# Patient Record
Sex: Female | Born: 1995 | Race: Black or African American | Hispanic: No | Marital: Single | Smoking: Current some day smoker
Health system: Southern US, Community
[De-identification: ages and names within clinical notes are randomized; demographics above are authoritative.]

---
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
## 2016-06-09 ENCOUNTER — Encounter (HOSPITAL_BASED_OUTPATIENT_CLINIC_OR_DEPARTMENT_OTHER): Payer: Self-pay | Admitting: Emergency Medicine

## 2016-06-09 ENCOUNTER — Emergency Department (HOSPITAL_BASED_OUTPATIENT_CLINIC_OR_DEPARTMENT_OTHER)
Admission: EM | Admit: 2016-06-09 | Discharge: 2016-06-09 | Disposition: A | Discharge status: Home / Self Care | Payer: Self-pay | Attending: Emergency Medicine | Admitting: Emergency Medicine

## 2016-06-09 DIAGNOSIS — A599 Trichomoniasis, unspecified: Secondary | ICD-10-CM | POA: Insufficient documentation

## 2016-06-09 DIAGNOSIS — N939 Abnormal uterine and vaginal bleeding, unspecified: Secondary | ICD-10-CM | POA: Insufficient documentation

## 2016-06-09 LAB — WET PREP, GENITAL
Clue Cells Wet Prep HPF POC: NONE SEEN
Sperm: NONE SEEN
Yeast Wet Prep HPF POC: NONE SEEN

## 2016-06-09 LAB — PREGNANCY, URINE: PREG TEST UR: NEGATIVE

## 2016-06-09 LAB — URINALYSIS, ROUTINE W REFLEX MICROSCOPIC
Bilirubin Urine: NEGATIVE
GLUCOSE, UA: NEGATIVE mg/dL
Ketones, ur: NEGATIVE mg/dL
Nitrite: NEGATIVE
PH: 6 (ref 5.0–8.0)
PROTEIN: NEGATIVE mg/dL
Specific Gravity, Urine: 1.022 (ref 1.005–1.030)

## 2016-06-09 LAB — URINALYSIS, MICROSCOPIC (REFLEX)

## 2016-06-09 MED ORDER — AZITHROMYCIN 250 MG PO TABS
1000.0000 mg | ORAL_TABLET | Freq: Once | ORAL | Status: AC
Start: 2016-06-09 — End: 2016-06-09
  Administered 2016-06-09: 1000 mg via ORAL
  Filled 2016-06-09: qty 4

## 2016-06-09 MED ORDER — CEFTRIAXONE SODIUM 250 MG IJ SOLR
250.0000 mg | Freq: Once | INTRAMUSCULAR | Status: AC
  Administered 2016-06-09: 250 mg via INTRAMUSCULAR
  Filled 2016-06-09: qty 250

## 2016-06-09 MED ORDER — METRONIDAZOLE 500 MG PO TABS
2000.0000 mg | ORAL_TABLET | Freq: Once | ORAL | Status: AC
  Administered 2016-06-09: 2000 mg via ORAL
  Filled 2016-06-09: qty 4

## 2016-06-09 MED ORDER — LIDOCAINE HCL (PF) 2 % IJ SOLN
INTRAMUSCULAR | Status: AC
  Administered 2016-06-09: 5 mL
  Filled 2016-06-09: qty 2

## 2016-06-09 NOTE — Discharge Instructions (Signed)

## 2016-06-09 NOTE — ED Provider Notes (Signed)
Emergency Department Provider Note   I have reviewed the triage vital signs and the nursing notes.   HISTORY  Chief Complaint Vaginal Bleeding   HPI Jennifer Schroeder is a 21 y.o. female with no significant PMH presents to the emergency department for evaluation of irregular vaginal bleeding. Patient states that her menstrual period started 1 week ago. She typically has 7 days of bleeding but approximately day for she had bleeding that stopped and then the next day started again very heavy with some clot passage. She did take 2 home pregnancy tests that were both negative. She is concerned that she could've had a miscarriage and is wondering about this today. She denies any nausea or vomiting. She does have some mild right lower quadrant pain that is cramping in quality and nonradiating. Her menstrual period stopped yesterday and she's had no additional bleeding this morning. No lightheadedness, difficulty breathing, other concerning symptoms.  History reviewed. No pertinent past medical history.  There are no active problems to display for this patient.   History reviewed. No pertinent surgical history.  Current Outpatient Rx  . Order #: [id number] Class: Print    Allergies Patient has no known allergies.  History reviewed. No pertinent family history.  Social History Social History  Substance Use Topics  . Smoking status: Never Smoker  . Smokeless tobacco: Never Used  . Alcohol use No    Review of Systems  Constitutional: No fever/chills Eyes: No visual changes. ENT: No sore throat. Cardiovascular: Denies chest pain. Respiratory: Denies shortness of breath. Gastrointestinal: Cramping abdominal pain.  No nausea, no vomiting.  No diarrhea.  No constipation. Genitourinary: Negative for dysuria. Positive vaginal bleeding.  Musculoskeletal: Negative for back pain. Skin: Negative for rash. Neurological: Negative for headaches, focal weakness or numbness.  10-point ROS  otherwise negative.  ____________________________________________   PHYSICAL EXAM:  VITAL SIGNS: ED Triage Vitals  Enc Vitals Group     BP 06/09/16 0933 122/67     Pulse Rate 06/09/16 0933 64     Resp 06/09/16 0933 18     Temp 06/09/16 0933 98.5 F (36.9 C)     Temp Source 06/09/16 0933 Oral     SpO2 06/09/16 0933 100 %     Weight 06/09/16 0934 170 lb (77.1 kg)     Height 06/09/16 0934  (1.626 m)   Constitutional: Alert and oriented. Well appearing and in no acute distress. Eyes: Conjunctivae are normal.  Head: Atraumatic. Nose: No congestion/rhinnorhea. Mouth/Throat: Mucous membranes are moist.   Neck: No stridor.   Cardiovascular: Normal rate, regular rhythm. Good peripheral circulation. Grossly normal heart sounds.   Respiratory: Normal respiratory effort.  No retractions. Lungs CTAB. Gastrointestinal: Soft and nontender. No distention.  Genitourinary: No vaginal bleeding. Mild discharge. No CMT or adnexal tenderness to fullness.  Musculoskeletal: No lower extremity tenderness nor edema. No gross deformities of extremities. Neurologic:  Normal speech and language. No gross focal neurologic deficits are appreciated.  Skin:  Skin is warm, dry and intact. No rash noted. Psychiatric: Mood and affect are normal. Speech and behavior are normal.  ____________________________________________   LABS (all labs ordered are listed, but only abnormal results are displayed)  Labs Reviewed  WET PREP, GENITAL - Abnormal; Notable for the following:       Result Value   Trich, Wet Prep PRESENT (*)    WBC, Wet Prep HPF POC MANY (*)    All other components within normal limits  URINALYSIS, ROUTINE W REFLEX MICROSCOPIC - Abnormal;  Notable for the following:    APPearance CLOUDY (*)    Hgb urine dipstick TRACE (*)    Leukocytes, UA MODERATE (*)    All other components within normal limits  URINALYSIS, MICROSCOPIC (REFLEX) - Abnormal; Notable for the following:    Bacteria, UA  MANY (*)    Squamous Epithelial / LPF 0-5 (*)    All other components within normal limits  PREGNANCY, URINE  GC/CHLAMYDIA PROBE AMP ([place]) NOT AT [facility]   ____________________________________________   PROCEDURES  Procedure(s) performed:   Procedures  None ____________________________________________   INITIAL IMPRESSION / ASSESSMENT AND PLAN / ED COURSE  Pertinent labs & imaging results that were available during my care of the patient were reviewed by me and considered in my medical decision making (see chart for details).  Patient resents to the emergency department for evaluation of irregular vaginal bleeding. Her pelvic exam is largely unremarkable. No active bleeding at this time. Pregnancy test is negative. Awaiting results of urinalysis and wet prep. No concern for STD exposure so will not treat in the ED. No reproducible tenderness to palpation of the RLQ.   Patient positive for trich. Treated for trich as well as chlamydia and gonorrhea. UA results likely 2/2 to trich infection. No symptoms of UTI so will not treat. Discussed treatment of any/all sexual partners.   At this time, I do not feel there is any life-threatening condition present. I have reviewed and discussed all results (EKG, imaging, lab, urine as appropriate), exam findings with patient. I have reviewed nursing notes and appropriate previous records.  I feel the patient is safe to be discharged home without further emergent workup. Discussed usual and customary return precautions. Patient and family (if present) verbalize understanding and are comfortable with this plan.  Patient will follow-up with their primary care provider. If they do not have a primary care provider, information for follow-up has been provided to them. All questions have been answered.  ____________________________________________  FINAL CLINICAL IMPRESSION(S) / ED DIAGNOSES  Final diagnoses:  Vaginal bleeding  Trichomonas  infection     MEDICATIONS GIVEN DURING THIS VISIT:  Medications  metroNIDAZOLE (FLAGYL) tablet 2,000 mg (2,000 mg Oral Given 06/09/16 1156)  azithromycin (ZITHROMAX) tablet 1,000 mg (1,000 mg Oral Given 06/09/16 1157)  cefTRIAXone (ROCEPHIN) injection 250 mg (250 mg Intramuscular Given 06/09/16 1157)  lidocaine (XYLOCAINE) 2 % injection (5 mLs  Given 06/09/16 1157)     NEW OUTPATIENT MEDICATIONS STARTED DURING THIS VISIT:  None   Note:  This document was prepared using Dragon voice recognition software and may include unintentional dictation errors.  Alona Bene, MD Emergency Medicine   Maia Plan, MD 06/09/16 [phone]

## 2016-06-10 LAB — GC/CHLAMYDIA PROBE AMP (~~LOC~~) NOT AT ARMC
Chlamydia: NEGATIVE
Neisseria Gonorrhea: NEGATIVE

## 2016-07-30 ENCOUNTER — Emergency Department (HOSPITAL_BASED_OUTPATIENT_CLINIC_OR_DEPARTMENT_OTHER)
Admission: EM | Admit: 2016-07-30 | Discharge: 2016-07-30 | Disposition: A | Discharge status: Home / Self Care | Payer: Self-pay | Attending: Emergency Medicine | Admitting: Emergency Medicine

## 2016-07-30 ENCOUNTER — Encounter (HOSPITAL_BASED_OUTPATIENT_CLINIC_OR_DEPARTMENT_OTHER): Payer: Self-pay | Admitting: *Deleted

## 2016-07-30 DIAGNOSIS — R590 Localized enlarged lymph nodes: Secondary | ICD-10-CM | POA: Insufficient documentation

## 2016-07-30 NOTE — ED Triage Notes (Signed)
Swollen node on the right side of her neck. She has been taking Amoxicillin for a tooth infection for the past 2 days.

## 2016-07-30 NOTE — Discharge Instructions (Signed)
Continue taking her antibiotics as prescribed until completed for your dental infection. I recommend refraining from palpating the area to prevent worsening of swelling.  I recommend following up with your primary care provider within the next 1-2 weeks if your lymph node has not improved.

## 2016-07-30 NOTE — ED Provider Notes (Signed)
[facility] Provider Note   CSN: [id number] Arrival date & time: 07/30/16  2022   By signing my name below, I, Soijett Blue, attest that this documentation has been prepared under the direction and in the presence of Melburn HakeNicole Nadeau, PA-C Electronically Signed: Soijett Blue, ED Scribe. 07/30/16. 9:05 PM.  History   Chief Complaint Chief Complaint  Patient presents with  . Lymphadenopathy    HPI Jennifer LessenJada Schroeder is a 21 y.o. female who presents to the Emergency Department complaining of "knot" to right sid of neck onset today. She notes that she is currently being treated for a right sided dental infection with amoxil which she has been taking for the past two days. Pt reports associated right sided dental pain which she notes has improved. She denies fever, chills, trouble swallowing, rash, facial swelling, vomiting and any other symptoms.   The history is provided by the patient. No language interpreter was used.    History reviewed. No pertinent past medical history.  There are no active problems to display for this patient.   History reviewed. No pertinent surgical history.  OB History    No data available       Home Medications    Prior to Admission medications   Medication Sig Start Date End Date Taking? Authorizing Provider  AMOXICILLIN PO Take by mouth.   Yes [provider]  cephALEXin (KEFLEX) 500 MG capsule Take 1 capsule (500 mg total) by mouth 3 (three) times daily. 05/23/15   Azalia Bilisampos, Kevin, MD    Family History No family history on file.  Social History Social History  Substance Use Topics  . Smoking status: Never Smoker  . Smokeless tobacco: Never Used  . Alcohol use No     Allergies   Patient has no known allergies.   Review of Systems Review of Systems  Constitutional: Negative for chills and fever.  HENT: Positive for dental problem (right sided). Negative for trouble swallowing.   Skin: Negative for rash.     Physical  Exam Updated Vital Signs BP 129/71   Pulse 69   Temp 99 F (37.2 C) (Oral)   Resp 16   Ht 5\' 5"  (1.651 m)   Wt 166 lb (75.3 kg)   LMP 07/22/2016   SpO2 100%   BMI 27.62 kg/m   Physical Exam  Constitutional: She is oriented to person, place, and time. She appears well-developed and well-nourished. No distress.  HENT:  Head: Normocephalic and atraumatic.  Mouth/Throat: Uvula is midline, oropharynx is clear and moist and mucous membranes are normal. Abnormal dentition. No oropharyngeal exudate.  Poor dentition throughout with multiple dental caries. Right lower wisdom tooth partially erupted with mild swelling to surrounding gingiva, no erythema, warmth, or fluctuance. No facial or neck swelling. No trismus or drooling. Pt tolerating secretions.  Eyes: Conjunctivae and EOM are normal. Right eye exhibits no discharge. Left eye exhibits no discharge. No scleral icterus.  Neck: Normal range of motion. Neck supple.  No neck stiffness  Cardiovascular: Normal rate, regular rhythm, normal heart sounds and intact distal pulses.   Pulmonary/Chest: Effort normal and breath sounds normal. No respiratory distress. She has no wheezes. She has no rales.  Abdominal: Soft. She exhibits no distension.  Musculoskeletal: She exhibits no edema.  Lymphadenopathy:       Head (right side): Submandibular adenopathy present.    She has cervical adenopathy.  Single small (<1cm), soft, mobile non-tender palpable node noted to right submandibular region. No surrounding erythema, swelling, or  warmth.   Neurological: She is alert and oriented to person, place, and time.  Skin: Skin is warm and dry. She is not diaphoretic.  Nursing note and vitals reviewed.    ED Treatments / Results  DIAGNOSTIC STUDIES: Oxygen Saturation is 100% on RA, nl by my interpretation.    COORDINATION OF CARE: 8:59 PM Discussed treatment plan with pt at bedside which includes continue abx Rx and pt agreed to plan.   Labs (all  labs ordered are listed, but only abnormal results are displayed) Labs Reviewed - No data to display  EKG  EKG Interpretation None       Radiology No results found.  Procedures Procedures (including critical care time)  Medications Ordered in ED Medications - No data to display   Initial Impression / Assessment and Plan / ED Course  I have reviewed the triage vital signs and the nursing notes.  Pertinent labs & imaging results that were available during my care of the patient were reviewed by me and considered in my medical decision making (see chart for details).     Patient presents with concern regarding right neck lymph node that she first noticed earlier today. Reports history of recent dental infection 2 right upper and lower molars which she states she has been taking amoxicillin for the past 2 days with improvement. Denies fever, neck stiffness, facial swelling, difficulty breathing, vomiting, rash, redness. VSS. Exam revealed small single mobile nontender lymph node present to right submandibular region. No surrounding swelling, erythema or warmth. Suspect patient's lymphadenopathy is due to nonmalignant cause, likely related to recent dental infection. Advised patient to continue taking her antibiotics as prescribed until completed. Advised to follow-up with her PCP within the next 1-2 weeks if her lymph node has not improved. Discussed return precautions.  Final Clinical Impressions(s) / ED Diagnoses   Final diagnoses:  Lymphadenopathy, submandibular    New Prescriptions New Prescriptions   No medications on file   I personally performed the services described in this documentation, which was scribed in my presence. The recorded information has been reviewed and is accurate.     Barrett Henle, PA-C 07/30/16 2112    Geoffery Lyons, MD 07/30/16 2123

## 2017-11-16 ENCOUNTER — Other Ambulatory Visit: Payer: Self-pay

## 2017-11-16 ENCOUNTER — Encounter (HOSPITAL_BASED_OUTPATIENT_CLINIC_OR_DEPARTMENT_OTHER): Payer: Self-pay

## 2017-11-16 ENCOUNTER — Emergency Department (HOSPITAL_BASED_OUTPATIENT_CLINIC_OR_DEPARTMENT_OTHER)
Admission: EM | Admit: 2017-11-16 | Discharge: 2017-11-16 | Disposition: A | Discharge status: Home / Self Care | Payer: Self-pay | Attending: Emergency Medicine | Admitting: Emergency Medicine

## 2017-11-16 DIAGNOSIS — N760 Acute vaginitis: Secondary | ICD-10-CM | POA: Insufficient documentation

## 2017-11-16 DIAGNOSIS — Z202 Contact with and (suspected) exposure to infections with a predominantly sexual mode of transmission: Secondary | ICD-10-CM | POA: Insufficient documentation

## 2017-11-16 DIAGNOSIS — N898 Other specified noninflammatory disorders of vagina: Secondary | ICD-10-CM

## 2017-11-16 DIAGNOSIS — B9689 Other specified bacterial agents as the cause of diseases classified elsewhere: Secondary | ICD-10-CM | POA: Insufficient documentation

## 2017-11-16 DIAGNOSIS — Z79899 Other long term (current) drug therapy: Secondary | ICD-10-CM | POA: Insufficient documentation

## 2017-11-16 LAB — WET PREP, GENITAL
Sperm: NONE SEEN
Trich, Wet Prep: NONE SEEN
YEAST WET PREP: NONE SEEN

## 2017-11-16 LAB — URINALYSIS, ROUTINE W REFLEX MICROSCOPIC
Bilirubin Urine: NEGATIVE
Glucose, UA: NEGATIVE mg/dL
Hgb urine dipstick: NEGATIVE
Ketones, ur: 15 mg/dL — AB
Leukocytes, UA: NEGATIVE
Nitrite: NEGATIVE
Protein, ur: NEGATIVE mg/dL
Specific Gravity, Urine: 1.025 (ref 1.005–1.030)
pH: 6 (ref 5.0–8.0)

## 2017-11-16 LAB — PREGNANCY, URINE: Preg Test, Ur: NEGATIVE

## 2017-11-16 MED ORDER — CEFTRIAXONE SODIUM 250 MG IJ SOLR
250.0000 mg | Freq: Once | INTRAMUSCULAR | Status: AC
  Administered 2017-11-16: 250 mg via INTRAMUSCULAR
  Filled 2017-11-16: qty 250

## 2017-11-16 MED ORDER — METRONIDAZOLE 500 MG PO TABS: 500.0000 mg | ORAL_TABLET | Freq: Two times a day (BID) | ORAL | 0 refills | Status: AC

## 2017-11-16 MED ORDER — AZITHROMYCIN 250 MG PO TABS
1000.0000 mg | ORAL_TABLET | Freq: Once | ORAL | Status: AC
  Administered 2017-11-16: 1000 mg via ORAL
  Filled 2017-11-16: qty 4

## 2017-11-16 NOTE — ED Triage Notes (Signed)
C/o abd pain, constipation, vaginal d/c x 1.5 weeks-NAD-steady gait

## 2017-11-16 NOTE — Discharge Instructions (Signed)
I have prescribed antibiotics to treat your infection please take 1 tablet twice a day for the next 7 days.  Please be advised that you cannot drink with this antibiotic as they can cause nausea and vomiting.  Please follow-up with your PCP as needed.

## 2017-11-16 NOTE — ED Provider Notes (Addendum)
[facility] EMERGENCY DEPARTMENT Provider Note   CSN: [id number] Arrival date & time: 11/16/17  1908     History   Chief Complaint Chief Complaint  Patient presents with  . Abdominal Pain    HPI Jennifer Schroeder is a 22 y.o. female.  22 y.o female with no PMH presents to the ED with a chief complaint of lower abdominal pain and vaginal discharge x 1.5 weeks. Patient describes her pain as intermittent cramp along the lower part of her abdomen with no radiation.  She also reports some nausea along with being bloated.  Patient reports she is also having some vaginal discharge which she describes as milky white/gray with a different odor to it.  Patient has tried no medical therapy for her symptoms but states she has been drinking a lot of water and eating a lot of yogurt.  Patient does report some dysuria which has seemed to [facility] , she denies any hematuria.  Patient does have a previous STI history of trichomonas and states "this feels just like it ", I just feel unwell.  Patient denies any diarrhea, vomiting, fever, shortness of breath or chest pain.     History reviewed. No pertinent past medical history.  There are no active problems to display for this patient.   History reviewed. No pertinent surgical history.   OB History   None      Home Medications    Prior to Admission medications   Medication Sig Start Date End Date Taking? Authorizing Provider  AMOXICILLIN PO Take by mouth.    [provider]  cephALEXin (KEFLEX) 500 MG capsule Take 1 capsule (500 mg total) by mouth 3 (three) times daily. 05/23/15   Azalia Bilis, MD  metroNIDAZOLE (FLAGYL) 500 MG tablet Take 1 tablet (500 mg total) by mouth 2 (two) times daily for 7 days. 11/16/17 11/23/17  Claude Manges, PA-C    Family History No family history on file.  Social History Social History   Tobacco Use  . Smoking status: Never Smoker  . Smokeless tobacco: Never Used  Substance Use Topics  .  Alcohol use: No  . Drug use: No     Allergies   Patient has no known allergies.   Review of Systems Review of Systems  Constitutional: Negative for fever.  HENT: Negative for sore throat.   Respiratory: Negative for shortness of breath and wheezing.   Cardiovascular: Negative for chest pain and palpitations.  Gastrointestinal: Positive for abdominal pain and nausea. Negative for blood in stool, constipation, diarrhea and vomiting.  Genitourinary: Positive for dysuria and vaginal discharge. Negative for decreased urine volume, flank pain, hematuria, pelvic pain, urgency, vaginal bleeding and vaginal pain.  Musculoskeletal: Negative for back pain and myalgias.  Skin: Negative for pallor and wound.  Neurological: Negative for syncope, light-headedness and headaches.  All other systems reviewed and are negative.    Physical Exam Updated Vital Signs BP 127/67 (BP Location: Right Arm)   Pulse 63   Temp 98.8 F (37.1 C) (Oral)   Resp 20   Ht 5\' 4"  (1.626 m)   Wt 82.1 kg   LMP 10/21/2017   SpO2 100%   BMI 31.07 kg/m   Physical Exam  Constitutional: She appears well-developed and well-nourished.  HENT:  Head: Normocephalic and atraumatic.  Cardiovascular: Normal heart sounds.  Pulmonary/Chest: Effort normal and breath sounds normal. She has no wheezes.  Abdominal: Soft. Bowel sounds are normal. There is tenderness in the suprapubic area. There is no CVA  tenderness.  Genitourinary: Pelvic exam was performed with patient supine.  Genitourinary Comments: Copious amount of milky white discharge with foul odor, no cervical motion tenderness.  No adnexal tenderness on right or left side.  Skin: Skin is warm and dry.  Nursing note and vitals reviewed.    ED Treatments / Results  Labs (all labs ordered are listed, but only abnormal results are displayed) Labs Reviewed  WET PREP, GENITAL - Abnormal; Notable for the following components:      Result Value   Clue Cells Wet Prep  HPF POC PRESENT (*)    WBC, Wet Prep HPF POC FEW (*)    All other components within normal limits  URINALYSIS, ROUTINE W REFLEX MICROSCOPIC - Abnormal; Notable for the following components:   Ketones, ur 15 (*)    All other components within normal limits  PREGNANCY, URINE  GC/CHLAMYDIA PROBE AMP ([place]) NOT AT [facility]    EKG None  Radiology No results found.  Procedures Procedures (including critical care time)  Medications Ordered in ED Medications  cefTRIAXone (ROCEPHIN) injection 250 mg (has no administration in time range)  azithromycin (ZITHROMAX) tablet 1,000 mg (has no administration in time range)     Initial Impression / Assessment and Plan / ED Course  I have reviewed the triage vital signs and the nursing notes.  Pertinent labs & imaging results that were available during my care of the patient were reviewed by me and considered in my medical decision making (see chart for details).     Patient presents with complaints of vaginal discharge, lower abdominal pain, dysuria.  This is been going on for 1-1/2 weeks, patient has tried no medication for her symptoms.  Patient does have a previous history of trichomonas and states this feels just like it will order pelvic exam to collect gonorrhea and chlamydia samples along with wet prep.  UA showed no leukocytes, nitrites, white blood cell count, it appears normal but patient does complain of dysuria will need pelvic exam to further assess any sources of infection.  Patient's vitals are stable during ED visit she is afebrile.  Her urine pregnancy test was also negative.  Patient's work-up in the ED shows urinalysis is negative for any nitrates, leukocytosis.  Patient's wet prep returned back with clue cells and white blood cells at this time I will treat patient for bacterial vaginosis as she is reporting symptoms.  Patient would also like prophylactic treatment for gonorrhea and chlamydia as she states she might have  been exposed.  Patient understands she needs to take this antibiotic for the next 7 days.  Patient agrees and understands treatment.  Vitals stable during ED visit, patient stable for discharge.  Return precautions provided.  Final Clinical Impressions(s) / ED Diagnoses   Final diagnoses:  Bacterial vaginosis  Vaginal discharge    ED Discharge Orders         Ordered    metroNIDAZOLE (FLAGYL) 500 MG tablet  2 times daily     11/16/17 2225           Claude Manges, PA-C 11/16/17 2225    Claude Manges, PA-C 11/16/17 2225    Raeford Razor, MD 11/21/17 [phone]

## 2017-11-17 LAB — GC/CHLAMYDIA PROBE AMP (~~LOC~~) NOT AT ARMC
CHLAMYDIA, DNA PROBE: NEGATIVE
Neisseria Gonorrhea: NEGATIVE

## 2019-11-23 ENCOUNTER — Emergency Department (HOSPITAL_BASED_OUTPATIENT_CLINIC_OR_DEPARTMENT_OTHER)
Admission: EM | Admit: 2019-11-23 | Discharge: 2019-11-23 | Disposition: A | Discharge status: Home / Self Care | Payer: Self-pay | Attending: Emergency Medicine | Admitting: Emergency Medicine

## 2019-11-23 ENCOUNTER — Other Ambulatory Visit: Payer: Self-pay

## 2019-11-23 DIAGNOSIS — N898 Other specified noninflammatory disorders of vagina: Secondary | ICD-10-CM

## 2019-11-23 DIAGNOSIS — K644 Residual hemorrhoidal skin tags: Secondary | ICD-10-CM | POA: Insufficient documentation

## 2019-11-23 DIAGNOSIS — N760 Acute vaginitis: Secondary | ICD-10-CM | POA: Insufficient documentation

## 2019-11-23 DIAGNOSIS — B9689 Other specified bacterial agents as the cause of diseases classified elsewhere: Secondary | ICD-10-CM

## 2019-11-23 LAB — URINALYSIS, MICROSCOPIC (REFLEX)

## 2019-11-23 LAB — URINALYSIS, ROUTINE W REFLEX MICROSCOPIC
Bilirubin Urine: NEGATIVE
Glucose, UA: NEGATIVE mg/dL
Ketones, ur: NEGATIVE mg/dL
Leukocytes,Ua: NEGATIVE
Nitrite: NEGATIVE
Protein, ur: NEGATIVE mg/dL
Specific Gravity, Urine: 1.02 (ref 1.005–1.030)
pH: 6.5 (ref 5.0–8.0)

## 2019-11-23 LAB — WET PREP, GENITAL
Sperm: NONE SEEN
Trich, Wet Prep: NONE SEEN
Yeast Wet Prep HPF POC: NONE SEEN

## 2019-11-23 LAB — PREGNANCY, URINE: Preg Test, Ur: NEGATIVE

## 2019-11-23 MED ORDER — METRONIDAZOLE 500 MG PO TABS: 500.0000 mg | ORAL_TABLET | Freq: Two times a day (BID) | ORAL | 0 refills | Status: DC

## 2019-11-23 MED ORDER — HYDROCORTISONE ACETATE 25 MG RE SUPP: 25.0000 mg | Freq: Two times a day (BID) | RECTAL | 0 refills | Status: DC

## 2019-11-23 NOTE — ED Triage Notes (Signed)
Reports multiple complaints . Mouth " issues", gum irritation, also reports Hx hemorrhoids, rectal pain,  Vaginal issues, discharge , itching .

## 2019-11-23 NOTE — ED Notes (Signed)
ED Provider at bedside. 

## 2019-11-23 NOTE — ED Provider Notes (Signed)
[facility] EMERGENCY DEPARTMENT Provider Note   CSN: [id number] Arrival date & time: 11/23/19  0725     History Chief Complaint  Patient presents with  . Multiple complaints    Jennifer Schroeder is a 24 y.o. female.  The history is provided by the patient and medical records. No language interpreter was used.  Vaginal Discharge Quality:  Milky and white Severity:  Moderate Onset quality:  Gradual Duration:  3 weeks Timing:  Constant Progression:  Waxing and waning Chronicity:  Recurrent Relieved by:  Nothing Worsened by:  Nothing Ineffective treatments:  None tried Associated symptoms: dysuria and vaginal itching   Associated symptoms: no abdominal pain, no fever, no genital lesions, no nausea, no rash, no urinary frequency, no urinary hesitancy, no urinary incontinence and no vomiting        No past medical history on file.  There are no problems to display for this patient.   No past surgical history on file.   OB History   No obstetric history on file.     No family history on file.  Social History   Tobacco Use  . Smoking status: Never Smoker  . Smokeless tobacco: Never Used  Substance Use Topics  . Alcohol use: No  . Drug use: No    Home Medications Prior to Admission medications   Medication Sig Start Date End Date Taking? Authorizing Provider  AMOXICILLIN PO Take by mouth.    [provider]  cephALEXin (KEFLEX) 500 MG capsule Take 1 capsule (500 mg total) by mouth 3 (three) times daily. 05/23/15   Azalia Bilis, MD    Allergies    Patient has no known allergies.  Review of Systems   Review of Systems  Constitutional: Negative for chills, diaphoresis, fatigue and fever.  HENT: Positive for dental problem (left dental pain). Negative for congestion, rhinorrhea and sinus pain.   Eyes: Negative for visual disturbance.  Respiratory: Negative for cough and shortness of breath.   Cardiovascular: Negative for chest pain.    Gastrointestinal: Positive for rectal pain. Negative for abdominal pain, constipation, diarrhea, nausea and vomiting.  Genitourinary: Positive for dysuria and vaginal discharge. Negative for bladder incontinence, flank pain, frequency, hesitancy, pelvic pain, vaginal bleeding and vaginal pain.  Musculoskeletal: Negative for neck pain and neck stiffness.  Skin: Negative for rash.  Neurological: Negative for headaches.  Psychiatric/Behavioral: Negative for agitation.  All other systems reviewed and are negative.   Physical Exam Updated Vital Signs BP 110/79   Pulse 66   Temp 98.1 F (36.7 C) (Oral)   Resp 18   Wt 77.1 kg   LMP 11/16/2019   SpO2 100%   BMI 29.18 kg/m   Physical Exam Vitals and nursing note reviewed. Exam conducted with a chaperone present.  Constitutional:      General: She is not in acute distress.    Appearance: She is well-developed. She is not ill-appearing, toxic-appearing or diaphoretic.  HENT:     Head: Normocephalic and atraumatic.     Nose: Nose normal. No congestion or rhinorrhea.     Mouth/Throat:     Mouth: Mucous membranes are moist.     Pharynx: No oropharyngeal exudate or posterior oropharyngeal erythema.  Eyes:     Conjunctiva/sclera: Conjunctivae normal.     Pupils: Pupils are equal, round, and reactive to light.  Cardiovascular:     Rate and Rhythm: Normal rate and regular rhythm.     Pulses: Normal pulses.     Heart sounds: No  murmur heard.   Pulmonary:     Effort: Pulmonary effort is normal. No respiratory distress.     Breath sounds: Normal breath sounds. No wheezing, rhonchi or rales.  Chest:     Chest wall: No tenderness.  Abdominal:     General: Abdomen is flat.     Palpations: Abdomen is soft.     Tenderness: There is no abdominal tenderness.  Genitourinary:    Labia:        Right: No tenderness or lesion.        Left: No tenderness or lesion.      Vagina: Vaginal discharge present. No erythema or tenderness.     Cervix:  Discharge present. No cervical motion tenderness, friability, lesion, erythema or cervical bleeding.     Uterus: Not tender.      Adnexa:        Right: No tenderness.         Left: No tenderness.       Rectum: External hemorrhoid present. No mass or internal hemorrhoid. Normal anal tone.     Comments: No herpetic lesions seen in vagina or rectal area.  A small hemorrhoid that is nonthrombosed is mildly tender.  Some mild vaginal discharge but otherwise no evidence of infection. Musculoskeletal:     Cervical back: Neck supple.  Skin:    General: Skin is warm and dry.     Capillary Refill: Capillary refill takes less than 2 seconds.     Findings: No erythema or rash.  Neurological:     General: No focal deficit present.     Mental Status: She is alert.  Psychiatric:        Thought Content: Thought content normal.     ED Results / Procedures / Treatments   Labs (all labs ordered are listed, but only abnormal results are displayed) Labs Reviewed  WET PREP, GENITAL - Abnormal; Notable for the following components:      Result Value   Clue Cells Wet Prep HPF POC PRESENT (*)    WBC, Wet Prep HPF POC MANY (*)    All other components within normal limits  URINALYSIS, ROUTINE W REFLEX MICROSCOPIC - Abnormal; Notable for the following components:   Hgb urine dipstick SMALL (*)    All other components within normal limits  URINALYSIS, MICROSCOPIC (REFLEX) - Abnormal; Notable for the following components:   Bacteria, UA MANY (*)    All other components within normal limits  URINE CULTURE  PREGNANCY, URINE  GC/CHLAMYDIA PROBE AMP () NOT AT [facility]    EKG None  Radiology No results found.  Procedures Procedures (including critical care time)  Medications Ordered in ED Medications - No data to display  ED Course  I have reviewed the triage vital signs and the nursing notes.  Pertinent labs & imaging results that were available during my care of the patient were  reviewed by me and considered in my medical decision making (see chart for details).    MDM Rules/Calculators/A&P                          Jennifer Schroeder is a 24 y.o. female with a past medical history significant for HSV-2 per Care Everywhere, BV, and hemorrhoids who presents with hemorrhoid rectal pain, vaginal discharge with vaginal irritation, dysuria and some oral pain.  Patient reports this is all been ongoing for weeks.  She reports that it finally got to the point where she needed  to get evaluated.  She says that she is previously been seen by a PCP and OB/GYN.  She had a reassuring Pap smear and pelvic exam several months ago.  She says that her discharge feels similar to when she had been in the past and she denies having any ulcerations or painful blisters anywhere on her body.  She reports she knows she needs to see a dentist but has some left lower dental pain.  No reported lesions on the skin or cheek or mouth.  She denies nausea vomiting, chest pain or abdominal pain.  She denies any fevers or chills.  No coronavirus type symptoms.  She denies constipation or diarrhea.  She does report some dysuria.  On exam with a chaperone, patient had a nontender hemorrhoid that did not appear to be thrombosed.  No other rectal pain palpated.  Vaginal exam had a vaginal discharge that was white but otherwise did not have any irritation seen on the cervix.  No bleeding.  No cervical motion tenderness or adnexal tenderness.  Doubt PID or other uterine problem at this time.  Swabs were collected to look for BV and a GC/chlamydia.  Abdomen was nontender with normal bowel sounds.  Lungs clear and chest nontender.  Oral pharyngeal exam showed no evidence of blisters or herpetic type lesions in her cheeks gums or mouth.  She does have some mild tenderness in her left lower jaw but did not see evidence of Ludewig's angina.  Do not see a convincing dental abscess.  No evidence of PTA or RPA.  We will get swab sent  for the wet prep and GC/chlamydia.  We will get urinalysis given the dysuria.  We will check for pregnancy.  I spoke with pharmacy who felt that as she has no active appearing lesions or ulcerations concerning for active HSV, they felt Anusol was reasonable for her hemorrhoidal pain.  We will give prescription for this.  We will treat BV if we find it.  She is going to see a dentist upcoming for her dental pain but do not feel there is an abscess at this time.  Anticipate reassessment after labs.  10:39 AM Work-up has returned.  She does not have evidence of UTI with no nitrites or leukocytes.  Culture was sent.  Pregnancy is negative.  Wet prep does confirm BV which likely explains the discharge.  With lack of cervical motion tenderness or any pelvic tenderness, will hold on empiric treatment for STI until GC/chlamydia test returned.  After confirmation with pharmacy, will give Anusol to help with the rectal discomfort as there is no evidence of active herpetic lesions.  Patient agrees with plan of care.  Patient will follow up with PCP and OB/GYN.  She had no questions or concerns and was discharged in good condition.   Final Clinical Impression(s) / ED Diagnoses Final diagnoses:  BV (bacterial vaginosis)  Hemorrhoids, external without complications  Vaginal discharge    Rx / DC Orders ED Discharge Orders         Ordered    hydrocortisone (ANUSOL-HC) 25 MG suppository  2 times daily        11/23/19 1043    metroNIDAZOLE (FLAGYL) 500 MG tablet  2 times daily        11/23/19 1043          Clinical Impression: 1. BV (bacterial vaginosis)   2. Hemorrhoids, external without complications   3. Vaginal discharge     Disposition: Discharge  Condition: Good  I have discussed the results, Dx and Tx plan with the pt(& family if present). He/she/they expressed understanding and agree(s) with the plan. Discharge instructions discussed at great length. Strict return precautions discussed  and pt &/or family have verbalized understanding of the instructions. No further questions at time of discharge.    New Prescriptions   HYDROCORTISONE (ANUSOL-HC) 25 MG SUPPOSITORY    Place 1 suppository (25 mg total) rectally 2 (two) times daily.   METRONIDAZOLE (FLAGYL) 500 MG TABLET    Take 1 tablet (500 mg total) by mouth 2 (two) times daily.    Follow Up: [facility] [address] [place] [id number] [phone] Schedule an appointment as soon as possible for a visit    a PCP, OBGYN, and Dentist for further managemnt.        Melbourne Jakubiak, Canary Brimhristopher J, MD 11/23/19 1043

## 2019-11-23 NOTE — Discharge Instructions (Signed)
Your history and exam today are consistent with BV causing the discharge and vaginal discomfort.  There is no evidence of active herpetic lesions on your vaginal area or your rectum.  He did have a nonthrombosed hemorrhoid.  Please use the Anusol to help with this.  Please follow-up with dentist for your dental pain as we did not see evidence of acute infection or abscess.  Please take the antibiotics for the BV.  Please rest and stay hydrated.  If any symptoms change or worsen acutely, please return to the nearest emergency department.

## 2019-11-24 LAB — URINE CULTURE: Culture: <10000 — AB

## 2019-11-25 LAB — GC/CHLAMYDIA PROBE AMP (~~LOC~~) NOT AT ARMC
Chlamydia: NEGATIVE
Comment: NEGATIVE
Comment: NORMAL
Neisseria Gonorrhea: NEGATIVE

## 2020-02-03 ENCOUNTER — Other Ambulatory Visit: Payer: Self-pay

## 2020-02-03 ENCOUNTER — Emergency Department (HOSPITAL_BASED_OUTPATIENT_CLINIC_OR_DEPARTMENT_OTHER)
Admission: EM | Admit: 2020-02-03 | Discharge: 2020-02-03 | Disposition: A | Discharge status: Home / Self Care | Payer: Self-pay | Attending: Emergency Medicine | Admitting: Emergency Medicine

## 2020-02-03 ENCOUNTER — Encounter (HOSPITAL_BASED_OUTPATIENT_CLINIC_OR_DEPARTMENT_OTHER): Payer: Self-pay | Admitting: Emergency Medicine

## 2020-02-03 DIAGNOSIS — K0889 Other specified disorders of teeth and supporting structures: Secondary | ICD-10-CM | POA: Insufficient documentation

## 2020-02-03 MED ORDER — PENICILLIN V POTASSIUM 500 MG PO TABS: 500.0000 mg | ORAL_TABLET | Freq: Four times a day (QID) | ORAL | 0 refills | Status: DC

## 2020-02-03 NOTE — ED Notes (Addendum)
Patient complains of left upper tooth being loose but her entire mouth hurts. Gums are red. Pt also complains of left ear pain from tooth.

## 2020-02-03 NOTE — ED Triage Notes (Signed)
Dental pain x 2 days

## 2020-02-03 NOTE — ED Provider Notes (Signed)
[facility] EMERGENCY DEPARTMENT Provider Note   CSN: [id number] Arrival date & time: 02/03/20  3614     History Chief Complaint  Patient presents with  . Dental Pain    Jennifer Schroeder is a 24 y.o. female.  24yoF who p/w dental pain. Pt reports 2 days of progressively worsening, severe L upper dental pain. Denies broken tooth or trauma. Has not seen dentist recently. Took Goody powder earlier with no relief. Denies swallowing or breathing problems. Admits to brushing teeth only once daily.  The history is provided by the patient.  Dental Pain Associated symptoms: no fever        History reviewed. No pertinent past medical history.  There are no problems to display for this patient.   History reviewed. No pertinent surgical history.   OB History   No obstetric history on file.     No family history on file.  Social History   Tobacco Use  . Smoking status: Never Smoker  . Smokeless tobacco: Never Used  Substance Use Topics  . Alcohol use: No  . Drug use: No    Home Medications Prior to Admission medications   Medication Sig Start Date End Date Taking? Authorizing Provider  hydrocortisone (ANUSOL-HC) 25 MG suppository Place 1 suppository (25 mg total) rectally 2 (two) times daily. 11/23/19   Tegeler, Canary Brim, MD  metroNIDAZOLE (FLAGYL) 500 MG tablet Take 1 tablet (500 mg total) by mouth 2 (two) times daily. 11/23/19   Tegeler, Canary Brim, MD  penicillin v potassium (VEETID) 500 MG tablet Take 1 tablet (500 mg total) by mouth 4 (four) times daily. 02/03/20   Delois Tolbert, Ambrose Finland, MD    Allergies    Patient has no known allergies.  Review of Systems   Review of Systems  Constitutional: Negative for fever.  HENT: Positive for dental problem. Negative for trouble swallowing.   Respiratory: Negative for shortness of breath.     Physical Exam Updated Vital Signs BP 131/75   Pulse (!) 59   Temp 97.9 F (36.6 C) (Oral)   Resp 16   Ht 5'  4" (1.626 m)   Wt 74.4 kg   LMP 01/05/2020   SpO2 100%   BMI 28.15 kg/m   Physical Exam Vitals and nursing note reviewed.  Constitutional:      General: She is not in acute distress.    Appearance: She is well-developed and well-nourished.  HENT:     Head: Normocephalic and atraumatic.     Mouth/Throat:      Comments: Significant plaque build-up on all teeth; L upper premolar tender to percussion without obvious decay or fracture; no gumline swelling; no facial swelling Eyes:     Conjunctiva/sclera: Conjunctivae normal.  Musculoskeletal:     Cervical back: Neck supple.  Skin:    General: Skin is warm and dry.  Neurological:     Mental Status: She is alert and oriented to person, place, and time.  Psychiatric:        Mood and Affect: Mood and affect normal.        Judgment: Judgment normal.     ED Results / Procedures / Treatments   Labs (all labs ordered are listed, but only abnormal results are displayed) Labs Reviewed - No data to display  EKG None  Radiology No results found.  Procedures Procedures (including critical care time)  Medications Ordered in ED Medications - No data to display  ED Course  I have reviewed the triage vital  signs and the nursing notes.  Pertinent labs & imaging results that were available during my care of the patient were reviewed by me and considered in my medical decision making (see chart for details).    MDM Rules/Calculators/A&P                          No evidence of abscess on exam. Will start on abx and instructed to f/u with dentist ASAP. Emphasized importance of brushing twice daily, avoiding sugary drinks. Counseled on soft diet and avoidance of chewy, hot, or ice cold foods. Reviewed return precautions. Final Clinical Impression(s) / ED Diagnoses Final diagnoses:  Pain, dental    Rx / DC Orders ED Discharge Orders         Ordered    penicillin v potassium (VEETID) 500 MG tablet  4 times daily        02/03/20  0855           Daylin Eads, Ambrose Finland, MD 02/03/20 1824

## 2020-08-28 ENCOUNTER — Emergency Department (HOSPITAL_COMMUNITY): Payer: Self-pay

## 2020-08-28 ENCOUNTER — Other Ambulatory Visit: Payer: Self-pay

## 2020-08-28 ENCOUNTER — Inpatient Hospital Stay (HOSPITAL_COMMUNITY): Payer: Self-pay

## 2020-08-28 ENCOUNTER — Encounter (HOSPITAL_COMMUNITY): Payer: Self-pay | Admitting: *Deleted

## 2020-08-28 ENCOUNTER — Inpatient Hospital Stay (HOSPITAL_COMMUNITY)
Admission: EM | Admit: 2020-08-28 | Discharge: 2020-08-31 | DRG: 493 | Disposition: A | Discharge status: Home / Self Care | Payer: Self-pay | Attending: Orthopedic Surgery | Admitting: Orthopedic Surgery

## 2020-08-28 DIAGNOSIS — Y92410 Unspecified street and highway as the place of occurrence of the external cause: Secondary | ICD-10-CM

## 2020-08-28 DIAGNOSIS — Z419 Encounter for procedure for purposes other than remedying health state, unspecified: Secondary | ICD-10-CM

## 2020-08-28 DIAGNOSIS — S42201A Unspecified fracture of upper end of right humerus, initial encounter for closed fracture: Secondary | ICD-10-CM | POA: Diagnosis present

## 2020-08-28 DIAGNOSIS — Z20822 Contact with and (suspected) exposure to covid-19: Secondary | ICD-10-CM | POA: Diagnosis present

## 2020-08-28 DIAGNOSIS — T148XXA Other injury of unspecified body region, initial encounter: Secondary | ICD-10-CM

## 2020-08-28 DIAGNOSIS — S42211A Unspecified displaced fracture of surgical neck of right humerus, initial encounter for closed fracture: Secondary | ICD-10-CM | POA: Diagnosis present

## 2020-08-28 DIAGNOSIS — E559 Vitamin D deficiency, unspecified: Secondary | ICD-10-CM | POA: Diagnosis present

## 2020-08-28 DIAGNOSIS — S42341A Displaced spiral fracture of shaft of humerus, right arm, initial encounter for closed fracture: Secondary | ICD-10-CM | POA: Diagnosis present

## 2020-08-28 DIAGNOSIS — D62 Acute posthemorrhagic anemia: Secondary | ICD-10-CM | POA: Diagnosis not present

## 2020-08-28 DIAGNOSIS — F17290 Nicotine dependence, other tobacco product, uncomplicated: Secondary | ICD-10-CM | POA: Diagnosis present

## 2020-08-28 DIAGNOSIS — S42351A Displaced comminuted fracture of shaft of humerus, right arm, initial encounter for closed fracture: Secondary | ICD-10-CM | POA: Diagnosis present

## 2020-08-28 DIAGNOSIS — M898X9 Other specified disorders of bone, unspecified site: Secondary | ICD-10-CM | POA: Diagnosis present

## 2020-08-28 LAB — CBC WITH DIFFERENTIAL/PLATELET
Abs Immature Granulocytes: 0.05 10*3/uL (ref 0.00–0.07)
Basophils Absolute: 0 10*3/uL (ref 0.0–0.1)
Basophils Relative: 0 %
Eosinophils Absolute: 0 10*3/uL (ref 0.0–0.5)
Eosinophils Relative: 0 %
HCT: 43.9 % (ref 36.0–46.0)
Hemoglobin: 14.6 g/dL (ref 12.0–15.0)
Immature Granulocytes: 0 %
Lymphocytes Relative: 23 %
Lymphs Abs: 2.6 10*3/uL (ref 0.7–4.0)
MCH: 29 pg (ref 26.0–34.0)
MCHC: 33.3 g/dL (ref 30.0–36.0)
MCV: 87.3 fL (ref 80.0–100.0)
Monocytes Absolute: 1.1 10*3/uL — ABNORMAL HIGH (ref 0.1–1.0)
Monocytes Relative: 9 %
Neutro Abs: 7.7 10*3/uL (ref 1.7–7.7)
Neutrophils Relative %: 68 %
Platelets: 258 10*3/uL (ref 150–400)
RBC: 5.03 MIL/uL (ref 3.87–5.11)
RDW: 15.1 % (ref 11.5–15.5)
WBC: 11.5 10*3/uL — ABNORMAL HIGH (ref 4.0–10.5)
nRBC: 0 % (ref 0.0–0.2)

## 2020-08-28 LAB — COMPREHENSIVE METABOLIC PANEL
ALT: 24 U/L (ref 0–44)
AST: 49 U/L — ABNORMAL HIGH (ref 15–41)
Albumin: 3.6 g/dL (ref 3.5–5.0)
Alkaline Phosphatase: 96 U/L (ref 38–126)
Anion gap: 8 (ref 5–15)
BUN: 5 mg/dL — ABNORMAL LOW (ref 6–20)
CO2: 23 mmol/L (ref 22–32)
Calcium: 9.6 mg/dL (ref 8.9–10.3)
Chloride: 106 mmol/L (ref 98–111)
Creatinine, Ser: 0.84 mg/dL (ref 0.44–1.00)
GFR, Estimated: >60 mL/min (ref >60)
Glucose, Bld: 101 mg/dL — ABNORMAL HIGH (ref 70–99)
Potassium: 3.8 mmol/L (ref 3.5–5.1)
Sodium: 137 mmol/L (ref 135–145)
Total Bilirubin: 0.3 mg/dL (ref 0.3–1.2)
Total Protein: 7.3 g/dL (ref 6.5–8.1)

## 2020-08-28 LAB — I-STAT BETA HCG BLOOD, ED (MC, WL, AP ONLY): I-stat hCG, quantitative: <5 m[IU]/mL (ref <5)

## 2020-08-28 LAB — RESP PANEL BY RT-PCR (FLU A&B, COVID) ARPGX2
Influenza A by PCR: NEGATIVE
Influenza B by PCR: NEGATIVE
SARS Coronavirus 2 by RT PCR: NEGATIVE

## 2020-08-28 MED ORDER — METHOCARBAMOL 500 MG PO TABS: 500.0000 mg | ORAL_TABLET | Freq: Four times a day (QID) | ORAL | Status: DC | PRN

## 2020-08-28 MED ORDER — HYDROCODONE-ACETAMINOPHEN 5-325 MG PO TABS
1.0000 | ORAL_TABLET | ORAL | Status: DC | PRN
  Administered 2020-08-29: 2 via ORAL
  Filled 2020-08-28: qty 2

## 2020-08-28 MED ORDER — CHLORHEXIDINE GLUCONATE 4 % EX LIQD: 60.0000 mL | Freq: Once | CUTANEOUS | Status: DC

## 2020-08-28 MED ORDER — ONDANSETRON HCL 4 MG PO TABS: 4.0000 mg | ORAL_TABLET | Freq: Four times a day (QID) | ORAL | Status: DC | PRN

## 2020-08-28 MED ORDER — HYDROCODONE-ACETAMINOPHEN 7.5-325 MG PO TABS
1.0000 | ORAL_TABLET | ORAL | Status: DC | PRN
Start: 2020-08-28 — End: 2020-08-29

## 2020-08-28 MED ORDER — FENTANYL CITRATE (PF) 100 MCG/2ML IJ SOLN
50.0000 ug | Freq: Once | INTRAMUSCULAR | Status: AC
  Administered 2020-08-28: 50 ug via INTRAVENOUS

## 2020-08-28 MED ORDER — POVIDONE-IODINE 10 % EX SWAB: 2.0000 "application " | Freq: Once | CUTANEOUS | Status: DC

## 2020-08-28 MED ORDER — HYDROMORPHONE HCL 1 MG/ML IJ SOLN
1.0000 mg | Freq: Once | INTRAMUSCULAR | Status: AC
  Administered 2020-08-28: 1 mg via INTRAVENOUS
  Filled 2020-08-28: qty 1

## 2020-08-28 MED ORDER — ONDANSETRON HCL 4 MG/2ML IJ SOLN
4.0000 mg | Freq: Four times a day (QID) | INTRAMUSCULAR | Status: DC | PRN
  Filled 2020-08-28: qty 2

## 2020-08-28 MED ORDER — ACETAMINOPHEN 500 MG PO TABS
500.0000 mg | ORAL_TABLET | Freq: Four times a day (QID) | ORAL | Status: DC
  Administered 2020-08-29: 500 mg via ORAL
  Filled 2020-08-28 (×2): qty 1

## 2020-08-28 MED ORDER — FENTANYL CITRATE (PF) 100 MCG/2ML IJ SOLN
INTRAMUSCULAR | Status: AC
  Filled 2020-08-28: qty 2

## 2020-08-28 MED ORDER — CEFAZOLIN SODIUM-DEXTROSE 2-4 GM/100ML-% IV SOLN
2.0000 g | INTRAVENOUS | Status: DC
  Filled 2020-08-28 (×2): qty 100

## 2020-08-28 MED ORDER — METHOCARBAMOL 1000 MG/10ML IJ SOLN
500.0000 mg | Freq: Four times a day (QID) | INTRAVENOUS | Status: DC | PRN
  Administered 2020-08-29: 500 mg via INTRAVENOUS
  Filled 2020-08-28 (×3): qty 5

## 2020-08-28 MED ORDER — ACETAMINOPHEN 325 MG PO TABS: 325.0000 mg | ORAL_TABLET | Freq: Four times a day (QID) | ORAL | Status: DC | PRN

## 2020-08-28 MED ORDER — IOHEXOL 300 MG/ML  SOLN
100.0000 mL | Freq: Once | INTRAMUSCULAR | Status: AC | PRN
  Administered 2020-08-28: 100 mL via INTRAVENOUS

## 2020-08-28 MED ORDER — MORPHINE SULFATE (PF) 2 MG/ML IV SOLN
0.5000 mg | INTRAVENOUS | Status: DC | PRN
  Administered 2020-08-29: 1 mg via INTRAVENOUS
  Filled 2020-08-28 (×3): qty 1

## 2020-08-28 NOTE — Progress Notes (Signed)
Orthopedic Tech Progress Note Patient Details:  Jennifer Schroeder 1995-03-06 [id number]  Ortho Devices Type of Ortho Device: Long arm splint Ortho Device/Splint Location: RUE Ortho Device/Splint Interventions: Ordered, Application, Adjustment   Post Interventions Patient Tolerated: Poor Instructions Provided: Poper ambulation with device, Care of device  Matteson Blue 08/28/2020, 8:45 PM

## 2020-08-28 NOTE — ED Notes (Signed)
Contacted lab for delay in CMP; lab still running

## 2020-08-28 NOTE — ED Notes (Signed)
Pt taken to CT; blood removed due to IV placement for imaging to be completed

## 2020-08-28 NOTE — ED Notes (Signed)
Shirloin Manson Passey called ask if the PT was call her back at [phone]
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
## 2020-08-29 ENCOUNTER — Inpatient Hospital Stay (HOSPITAL_COMMUNITY): Payer: Self-pay

## 2020-08-29 ENCOUNTER — Encounter (HOSPITAL_COMMUNITY): Payer: Self-pay | Admitting: Orthopedic Surgery

## 2020-08-29 ENCOUNTER — Encounter (HOSPITAL_COMMUNITY)
Admission: EM | Disposition: A | Discharge status: Home / Self Care | Payer: Self-pay | Source: Home / Self Care | Attending: Orthopedic Surgery

## 2020-08-29 ENCOUNTER — Inpatient Hospital Stay (HOSPITAL_COMMUNITY): Payer: Self-pay | Admitting: Anesthesiology

## 2020-08-29 DIAGNOSIS — S42351A Displaced comminuted fracture of shaft of humerus, right arm, initial encounter for closed fracture: Secondary | ICD-10-CM | POA: Diagnosis present

## 2020-08-29 HISTORY — PX: ORIF HUMERUS FRACTURE: SHX2126

## 2020-08-29 LAB — HIV ANTIBODY (ROUTINE TESTING W REFLEX): HIV Screen 4th Generation wRfx: NONREACTIVE

## 2020-08-29 LAB — SURGICAL PCR SCREEN
MRSA, PCR: NEGATIVE
Staphylococcus aureus: NEGATIVE

## 2020-08-29 SURGERY — OPEN REDUCTION INTERNAL FIXATION (ORIF) DISTAL HUMERUS FRACTURE
Anesthesia: General | Laterality: Right

## 2020-08-29 MED ORDER — FENTANYL CITRATE (PF) 100 MCG/2ML IJ SOLN: 25.0000 ug | INTRAMUSCULAR | Status: DC | PRN

## 2020-08-29 MED ORDER — ROCURONIUM BROMIDE 100 MG/10ML IV SOLN
INTRAVENOUS | Status: DC | PRN
  Administered 2020-08-29: 70 mg via INTRAVENOUS

## 2020-08-29 MED ORDER — ALUM & MAG HYDROXIDE-SIMETH 200-200-20 MG/5ML PO SUSP: 30.0000 mL | ORAL | Status: DC | PRN

## 2020-08-29 MED ORDER — FENTANYL CITRATE (PF) 250 MCG/5ML IJ SOLN
INTRAMUSCULAR | Status: AC
  Filled 2020-08-29: qty 5

## 2020-08-29 MED ORDER — DEXMEDETOMIDINE (PRECEDEX) IN NS 20 MCG/5ML (4 MCG/ML) IV SYRINGE
PREFILLED_SYRINGE | INTRAVENOUS | Status: AC
  Filled 2020-08-29: qty 5

## 2020-08-29 MED ORDER — TRANEXAMIC ACID-NACL 1000-0.7 MG/100ML-% IV SOLN
INTRAVENOUS | Status: DC | PRN
  Administered 2020-08-29: 1000 mg via INTRAVENOUS

## 2020-08-29 MED ORDER — TRAMADOL HCL 50 MG PO TABS
50.0000 mg | ORAL_TABLET | Freq: Three times a day (TID) | ORAL | Status: DC
  Administered 2020-08-29 – 2020-08-31 (×5): 50 mg via ORAL
  Filled 2020-08-29 (×5): qty 1

## 2020-08-29 MED ORDER — LIDOCAINE 2% (20 MG/ML) 5 ML SYRINGE
INTRAMUSCULAR | Status: AC
  Filled 2020-08-29: qty 5

## 2020-08-29 MED ORDER — HYDROMORPHONE HCL 1 MG/ML IJ SOLN
0.5000 mg | INTRAMUSCULAR | Status: DC | PRN
  Administered 2020-08-29: 1 mg via INTRAVENOUS
  Filled 2020-08-29: qty 1

## 2020-08-29 MED ORDER — FENTANYL CITRATE (PF) 100 MCG/2ML IJ SOLN
INTRAMUSCULAR | Status: AC
  Filled 2020-08-29: qty 2

## 2020-08-29 MED ORDER — BISACODYL 5 MG PO TBEC: 5.0000 mg | DELAYED_RELEASE_TABLET | Freq: Every day | ORAL | Status: DC | PRN

## 2020-08-29 MED ORDER — LACTATED RINGERS IV SOLN: INTRAVENOUS | Status: DC

## 2020-08-29 MED ORDER — OXYCODONE HCL 5 MG PO TABS
10.0000 mg | ORAL_TABLET | ORAL | Status: DC | PRN
  Administered 2020-08-30: 15 mg via ORAL
  Administered 2020-08-30 – 2020-08-31 (×2): 10 mg via ORAL
  Filled 2020-08-29: qty 3
  Filled 2020-08-29: qty 2

## 2020-08-29 MED ORDER — AMISULPRIDE (ANTIEMETIC) 5 MG/2ML IV SOLN
INTRAVENOUS | Status: AC
  Filled 2020-08-29: qty 4

## 2020-08-29 MED ORDER — ROCURONIUM BROMIDE 10 MG/ML (PF) SYRINGE
PREFILLED_SYRINGE | INTRAVENOUS | Status: AC
  Filled 2020-08-29: qty 10

## 2020-08-29 MED ORDER — TRANEXAMIC ACID-NACL 1000-0.7 MG/100ML-% IV SOLN
INTRAVENOUS | Status: AC
  Filled 2020-08-29: qty 100

## 2020-08-29 MED ORDER — PROMETHAZINE HCL 25 MG/ML IJ SOLN: 6.2500 mg | INTRAMUSCULAR | Status: DC | PRN

## 2020-08-29 MED ORDER — ONDANSETRON HCL 4 MG/2ML IJ SOLN
INTRAMUSCULAR | Status: AC
  Filled 2020-08-29: qty 2

## 2020-08-29 MED ORDER — DEXMEDETOMIDINE (PRECEDEX) IN NS 20 MCG/5ML (4 MCG/ML) IV SYRINGE
PREFILLED_SYRINGE | INTRAVENOUS | Status: DC | PRN
  Administered 2020-08-29 (×3): 4 ug via INTRAVENOUS

## 2020-08-29 MED ORDER — LACTATED RINGERS IV SOLN: INTRAVENOUS | Status: DC | PRN

## 2020-08-29 MED ORDER — PANTOPRAZOLE SODIUM 40 MG PO TBEC
40.0000 mg | DELAYED_RELEASE_TABLET | Freq: Every day | ORAL | Status: DC
  Administered 2020-08-30 – 2020-08-31 (×2): 40 mg via ORAL
  Filled 2020-08-29 (×2): qty 1

## 2020-08-29 MED ORDER — VANCOMYCIN HCL 1000 MG IV SOLR
INTRAVENOUS | Status: DC | PRN
  Administered 2020-08-29: 1000 mg via TOPICAL

## 2020-08-29 MED ORDER — CHLORHEXIDINE GLUCONATE 0.12 % MT SOLN
OROMUCOSAL | Status: AC
  Administered 2020-08-29: 15 mL
  Filled 2020-08-29: qty 15

## 2020-08-29 MED ORDER — METOCLOPRAMIDE HCL 5 MG/ML IJ SOLN: 5.0000 mg | Freq: Three times a day (TID) | INTRAMUSCULAR | Status: DC | PRN

## 2020-08-29 MED ORDER — METHOCARBAMOL 1000 MG/10ML IJ SOLN
500.0000 mg | Freq: Four times a day (QID) | INTRAVENOUS | Status: DC
  Filled 2020-08-29: qty 5

## 2020-08-29 MED ORDER — ENOXAPARIN SODIUM 40 MG/0.4ML IJ SOSY
40.0000 mg | PREFILLED_SYRINGE | INTRAMUSCULAR | Status: DC
  Administered 2020-08-30 – 2020-08-31 (×2): 40 mg via SUBCUTANEOUS
  Filled 2020-08-29 (×2): qty 0.4

## 2020-08-29 MED ORDER — 0.9 % SODIUM CHLORIDE (POUR BTL) OPTIME
TOPICAL | Status: DC | PRN
  Administered 2020-08-29: 1000 mL

## 2020-08-29 MED ORDER — OXYCODONE HCL 5 MG PO TABS
ORAL_TABLET | ORAL | Status: AC
  Filled 2020-08-29: qty 1

## 2020-08-29 MED ORDER — ONDANSETRON HCL 4 MG PO TABS: 4.0000 mg | ORAL_TABLET | Freq: Four times a day (QID) | ORAL | Status: DC | PRN

## 2020-08-29 MED ORDER — ACETAMINOPHEN 500 MG PO TABS
1000.0000 mg | ORAL_TABLET | Freq: Three times a day (TID) | ORAL | Status: DC
  Administered 2020-08-29 – 2020-08-31 (×6): 1000 mg via ORAL
  Filled 2020-08-29 (×6): qty 2

## 2020-08-29 MED ORDER — DEXAMETHASONE SODIUM PHOSPHATE 10 MG/ML IJ SOLN
INTRAMUSCULAR | Status: AC
  Filled 2020-08-29: qty 1

## 2020-08-29 MED ORDER — POLYETHYLENE GLYCOL 3350 17 G PO PACK
17.0000 g | PACK | Freq: Every day | ORAL | Status: DC
  Filled 2020-08-29 (×2): qty 1

## 2020-08-29 MED ORDER — LIDOCAINE HCL (CARDIAC) PF 100 MG/5ML IV SOSY
PREFILLED_SYRINGE | INTRAVENOUS | Status: DC | PRN
  Administered 2020-08-29: 50 mg via INTRAVENOUS

## 2020-08-29 MED ORDER — ONDANSETRON HCL 4 MG/2ML IJ SOLN
INTRAMUSCULAR | Status: DC | PRN
  Administered 2020-08-29: 4 mg via INTRAVENOUS

## 2020-08-29 MED ORDER — CEFAZOLIN SODIUM-DEXTROSE 2-4 GM/100ML-% IV SOLN
2.0000 g | Freq: Four times a day (QID) | INTRAVENOUS | Status: AC
Start: 2020-08-29 — End: 2020-08-30
  Administered 2020-08-29 – 2020-08-30 (×3): 2 g via INTRAVENOUS
  Filled 2020-08-29 (×3): qty 100

## 2020-08-29 MED ORDER — PROPOFOL 10 MG/ML IV BOLUS
INTRAVENOUS | Status: AC
  Filled 2020-08-29: qty 40

## 2020-08-29 MED ORDER — METOCLOPRAMIDE HCL 10 MG PO TABS: 5.0000 mg | ORAL_TABLET | Freq: Three times a day (TID) | ORAL | Status: DC | PRN

## 2020-08-29 MED ORDER — AMISULPRIDE (ANTIEMETIC) 5 MG/2ML IV SOLN
10.0000 mg | Freq: Once | INTRAVENOUS | Status: AC | PRN
  Administered 2020-08-29: 10 mg via INTRAVENOUS

## 2020-08-29 MED ORDER — MENTHOL 3 MG MT LOZG: 1.0000 | LOZENGE | OROMUCOSAL | Status: DC | PRN

## 2020-08-29 MED ORDER — MIDAZOLAM HCL 2 MG/2ML IJ SOLN
INTRAMUSCULAR | Status: AC
  Filled 2020-08-29: qty 2

## 2020-08-29 MED ORDER — METHOCARBAMOL 500 MG PO TABS
1000.0000 mg | ORAL_TABLET | Freq: Four times a day (QID) | ORAL | Status: DC
  Administered 2020-08-29 – 2020-08-31 (×8): 1000 mg via ORAL
  Filled 2020-08-29 (×8): qty 2

## 2020-08-29 MED ORDER — MIDAZOLAM HCL 2 MG/2ML IJ SOLN
INTRAMUSCULAR | Status: AC
  Administered 2020-08-29: 1 mg via INTRAVENOUS
  Filled 2020-08-29: qty 2

## 2020-08-29 MED ORDER — CEFAZOLIN SODIUM-DEXTROSE 2-3 GM-%(50ML) IV SOLR
INTRAVENOUS | Status: DC | PRN
  Administered 2020-08-29: 2 g via INTRAVENOUS

## 2020-08-29 MED ORDER — SUGAMMADEX SODIUM 200 MG/2ML IV SOLN
INTRAVENOUS | Status: DC | PRN
  Administered 2020-08-29: 155 mg via INTRAVENOUS

## 2020-08-29 MED ORDER — MIDAZOLAM HCL 2 MG/2ML IJ SOLN: 1.0000 mg | Freq: Once | INTRAMUSCULAR | Status: AC

## 2020-08-29 MED ORDER — VANCOMYCIN HCL 1000 MG IV SOLR
INTRAVENOUS | Status: AC
  Filled 2020-08-29: qty 1000

## 2020-08-29 MED ORDER — DOCUSATE SODIUM 100 MG PO CAPS
100.0000 mg | ORAL_CAPSULE | Freq: Two times a day (BID) | ORAL | Status: DC
  Administered 2020-08-31: 100 mg via ORAL
  Filled 2020-08-29 (×5): qty 1

## 2020-08-29 MED ORDER — OXYCODONE HCL 5 MG PO TABS
5.0000 mg | ORAL_TABLET | ORAL | Status: DC | PRN
  Administered 2020-08-29: 5 mg via ORAL
  Filled 2020-08-29: qty 2

## 2020-08-29 MED ORDER — FENTANYL CITRATE (PF) 100 MCG/2ML IJ SOLN
INTRAMUSCULAR | Status: DC | PRN
  Administered 2020-08-29 (×10): 50 ug via INTRAVENOUS

## 2020-08-29 MED ORDER — ONDANSETRON HCL 4 MG/2ML IJ SOLN
4.0000 mg | Freq: Four times a day (QID) | INTRAMUSCULAR | Status: DC | PRN
  Administered 2020-08-31: 4 mg via INTRAVENOUS
  Filled 2020-08-29: qty 2

## 2020-08-29 MED ORDER — PROPOFOL 10 MG/ML IV BOLUS
INTRAVENOUS | Status: DC | PRN
  Administered 2020-08-29: 200 mg via INTRAVENOUS

## 2020-08-29 MED ORDER — PHENOL 1.4 % MT LIQD: 1.0000 | OROMUCOSAL | Status: DC | PRN

## 2020-08-29 MED ORDER — ACETAMINOPHEN 325 MG PO TABS: 325.0000 mg | ORAL_TABLET | Freq: Four times a day (QID) | ORAL | Status: DC | PRN

## 2020-08-29 MED ORDER — DEXAMETHASONE SODIUM PHOSPHATE 10 MG/ML IJ SOLN
INTRAMUSCULAR | Status: DC | PRN
  Administered 2020-08-29: 10 mg via INTRAVENOUS

## 2020-08-29 SURGICAL SUPPLY — 82 items
BAG COUNTER SPONGE SURGICOUNT (BAG) ×2 IMPLANT
BIT DRILL 110MM 85MM (BIT) ×1 IMPLANT
BIT DRILL 2.8 (BIT) ×1
BIT DRILL CANN QC 2.8X165 (BIT) ×1 IMPLANT
BIT DRILL QC 3.5X110 (BIT) ×2 IMPLANT
BIT DRILL QC SFS 2.5X170 (BIT) ×2 IMPLANT
BLADE AVERAGE 25X9 (BLADE) IMPLANT
BNDG COHESIVE 4X5 TAN STRL (GAUZE/BANDAGES/DRESSINGS) ×2 IMPLANT
BNDG ESMARK 4X9 LF (GAUZE/BANDAGES/DRESSINGS) IMPLANT
BNDG GAUZE ELAST 4 BULKY (GAUZE/BANDAGES/DRESSINGS) ×4 IMPLANT
BRUSH SCRUB EZ PLAIN DRY (MISCELLANEOUS) ×4 IMPLANT
CORD BIPOLAR FORCEPS 12FT (ELECTRODE) IMPLANT
COVER SURGICAL LIGHT HANDLE (MISCELLANEOUS) ×2 IMPLANT
DRAIN PENROSE 1/4X12 LTX STRL (WOUND CARE) IMPLANT
DRAPE C-ARM 42X72 X-RAY (DRAPES) ×2 IMPLANT
DRAPE C-ARMOR (DRAPES) IMPLANT
DRAPE HALF SHEET 40X57 (DRAPES) IMPLANT
DRAPE INCISE IOBAN 66X45 STRL (DRAPES) IMPLANT
DRAPE ORTHO SPLIT 77X108 STRL (DRAPES) ×1
DRAPE SURG ORHT 6 SPLT 77X108 (DRAPES) ×1 IMPLANT
DRAPE U-SHAPE 47X51 STRL (DRAPES) ×4 IMPLANT
DRILL BIT 110MM/85MM (BIT) ×1
DRILL BIT 2.8MM (BIT) ×1
DRSG ADAPTIC 3X8 NADH LF (GAUZE/BANDAGES/DRESSINGS) ×2 IMPLANT
DRSG MEPILEX BORDER 4X12 (GAUZE/BANDAGES/DRESSINGS) ×2 IMPLANT
DRSG MEPITEL 4X7.2 (GAUZE/BANDAGES/DRESSINGS) ×2 IMPLANT
DRSG PAD ABDOMINAL 8X10 ST (GAUZE/BANDAGES/DRESSINGS) ×2 IMPLANT
ELECT REM PT RETURN 9FT ADLT (ELECTROSURGICAL) ×2
ELECTRODE REM PT RTRN 9FT ADLT (ELECTROSURGICAL) ×1 IMPLANT
EVACUATOR 1/8 PVC DRAIN (DRAIN) IMPLANT
GAUZE SPONGE 4X4 12PLY STRL (GAUZE/BANDAGES/DRESSINGS) ×4 IMPLANT
GLOVE SRG 8 PF TXTR STRL LF DI (GLOVE) ×1 IMPLANT
GLOVE SURG ENC MOIS LTX SZ7.5 (GLOVE) ×2 IMPLANT
GLOVE SURG UNDER POLY LF SZ7.5 (GLOVE) ×2 IMPLANT
GLOVE SURG UNDER POLY LF SZ8 (GLOVE) ×1
GOWN STRL REUS W/ TWL LRG LVL3 (GOWN DISPOSABLE) ×1 IMPLANT
GOWN STRL REUS W/ TWL XL LVL3 (GOWN DISPOSABLE) ×2 IMPLANT
GOWN STRL REUS W/TWL LRG LVL3 (GOWN DISPOSABLE) ×1
GOWN STRL REUS W/TWL XL LVL3 (GOWN DISPOSABLE) ×2
KIT BASIN OR (CUSTOM PROCEDURE TRAY) ×2 IMPLANT
KIT TURNOVER KIT B (KITS) ×2 IMPLANT
MANIFOLD NEPTUNE II (INSTRUMENTS) ×2 IMPLANT
NEEDLE HYPO 25X1 1.5 SAFETY (NEEDLE) IMPLANT
NS IRRIG 1000ML POUR BTL (IV SOLUTION) ×2 IMPLANT
PACK ORTHO EXTREMITY (CUSTOM PROCEDURE TRAY) ×2 IMPLANT
PAD ARMBOARD 7.5X6 YLW CONV (MISCELLANEOUS) ×4 IMPLANT
PLATE LC DCP 2.0 6H (Plate) ×2 IMPLANT
PLATE LCP 3.5 18 HOLE (Plate) ×2 IMPLANT
SCREW CANC FT 4.0X45 (Screw) ×2 IMPLANT
SCREW CORTEX 2.0 22MM (Screw) ×2 IMPLANT
SCREW CORTEX 2.0 24MM (Screw) ×2 IMPLANT
SCREW CORTEX 3.5 24MM (Screw) ×1 IMPLANT
SCREW CORTEX 3.5 26MM (Screw) ×3 IMPLANT
SCREW CORTEX 3.5 34MM (Screw) ×1 IMPLANT
SCREW CORTEX ST 2.0X10 (Screw) ×4 IMPLANT
SCREW LOCK CORT ST 3.5X24 (Screw) ×1 IMPLANT
SCREW LOCK CORT ST 3.5X26 (Screw) ×3 IMPLANT
SCREW LOCK CORT ST 3.5X34 (Screw) ×1 IMPLANT
SCREW LOCK T15 FT 24X3.5X2.9X (Screw) ×1 IMPLANT
SCREW LOCK T15 FT 28X3.5X2.9X (Screw) ×1 IMPLANT
SCREW LOCK T15 FT 30X3.5X2.9X (Screw) ×1 IMPLANT
SCREW LOCKING 3.5X24 (Screw) ×1 IMPLANT
SCREW LOCKING 3.5X26 (Screw) ×2 IMPLANT
SCREW LOCKING 3.5X28 (Screw) ×1 IMPLANT
SCREW LOCKING 3.5X30 (Screw) ×1 IMPLANT
SPONGE T-LAP 18X18 ~~LOC~~+RFID (SPONGE) IMPLANT
STAPLER VISISTAT 35W (STAPLE) ×2 IMPLANT
STOCKINETTE IMPERVIOUS 9X36 MD (GAUZE/BANDAGES/DRESSINGS) IMPLANT
SUCTION FRAZIER HANDLE 10FR (MISCELLANEOUS) ×1
SUCTION TUBE FRAZIER 10FR DISP (MISCELLANEOUS) ×1 IMPLANT
SUT ETHILON 3 0 PS 1 (SUTURE) ×4 IMPLANT
SUT VIC AB 0 CT1 27 (SUTURE) ×2
SUT VIC AB 0 CT1 27XBRD ANBCTR (SUTURE) ×2 IMPLANT
SUT VIC AB 2-0 CT1 27 (SUTURE) ×2
SUT VIC AB 2-0 CT1 TAPERPNT 27 (SUTURE) ×2 IMPLANT
SYR 5ML LL (SYRINGE) IMPLANT
SYR CONTROL 10ML LL (SYRINGE) IMPLANT
TOWEL GREEN STERILE (TOWEL DISPOSABLE) ×6 IMPLANT
TOWEL GREEN STERILE FF (TOWEL DISPOSABLE) ×2 IMPLANT
TRAY FOLEY MTR SLVR 16FR STAT (SET/KITS/TRAYS/PACK) IMPLANT
WATER STERILE IRR 1000ML POUR (IV SOLUTION) ×2 IMPLANT
YANKAUER SUCT BULB TIP NO VENT (SUCTIONS) IMPLANT
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
## 2020-08-29 NOTE — Anesthesia Preprocedure Evaluation (Addendum)
Anesthesia Evaluation  Patient identified by MRN, date of birth, ID band Patient awake    Reviewed: Allergy & Precautions, NPO status , Patient's Chart, lab work & pertinent test results  History of Anesthesia Complications Negative for: history of anesthetic complications  Airway Mallampati: II  TM Distance: >3 FB Neck ROM: Full    Dental no notable dental hx. (+) Dental Advisory Given   Pulmonary Current Smoker and Patient abstained from smoking.,    Pulmonary exam normal        Cardiovascular negative cardio ROS Normal cardiovascular exam     Neuro/Psych negative neurological ROS  negative psych ROS   GI/Hepatic negative GI ROS, Neg liver ROS,   Endo/Other  negative endocrine ROS  Renal/GU negative Renal ROS  negative genitourinary   Musculoskeletal   Abdominal   Peds negative pediatric ROS (+)  Hematology negative hematology ROS (+)   Anesthesia Other Findings   Reproductive/Obstetrics negative OB ROS                            Anesthesia Physical Anesthesia Plan  ASA: 2  Anesthesia Plan: General   Post-op Pain Management:    Induction:   PONV Risk Score and Plan: 2 and Ondansetron, Midazolam and Dexamethasone  Airway Management Planned: Oral ETT  Additional Equipment:   Intra-op Plan:   Post-operative Plan: Extubation in OR  Informed Consent: I have reviewed the patients History and Physical, chart, labs and discussed the procedure including the risks, benefits and alternatives for the proposed anesthesia with the patient or authorized representative who has indicated his/her understanding and acceptance.     Dental advisory given  Plan Discussed with: Anesthesiologist and CRNA  Anesthesia Plan Comments: (Pt refused pnb for pain control.  )       Anesthesia Quick Evaluation

## 2020-08-29 NOTE — Anesthesia Postprocedure Evaluation (Signed)
Anesthesia Post Note  Patient: Jennifer Schroeder  Procedure(s) Performed: OPEN REDUCTION INTERNAL FIXATION (ORIF) DISTAL HUMERUS FRACTURE (Right)     Patient location during evaluation: PACU Anesthesia Type: General Level of consciousness: awake and alert Pain management: pain level controlled Vital Signs Assessment: post-procedure vital signs reviewed and stable Respiratory status: spontaneous breathing, nonlabored ventilation, respiratory function stable and patient connected to nasal cannula oxygen Cardiovascular status: blood pressure returned to baseline and stable Postop Assessment: no apparent nausea or vomiting Anesthetic complications: no   No notable events documented.  Last Vitals:  Vitals:   08/29/20 1555 08/29/20 1635  BP: (!) 141/84 (!) 141/87  Pulse: (!) 59 (!) 55  Resp: 15 18  Temp:  36.5 C  SpO2: 100% 100%    Last Pain:  Vitals:   08/29/20 1635  TempSrc: Oral  PainSc:                  [name]

## 2020-08-29 NOTE — Transfer of Care (Signed)
Immediate Anesthesia Transfer of Care Note  Patient: Jennifer Schroeder  Procedure(s) Performed: OPEN REDUCTION INTERNAL FIXATION (ORIF) DISTAL HUMERUS FRACTURE (Right)  Patient Location: PACU  Anesthesia Type:General  Level of Consciousness: awake, alert  and oriented  Airway & Oxygen Therapy: Patient Spontanous Breathing and Patient connected to nasal cannula oxygen  Post-op Assessment: Report given to RN and Post -op Vital signs reviewed and stable  Post vital signs: Reviewed and stable  Last Vitals:  Vitals Value Taken Time  BP 120/67 08/29/20 1425  Temp    Pulse 80 08/29/20 1428  Resp 18 08/29/20 1428  SpO2 100 % 08/29/20 1428  Vitals shown include unvalidated device data.  Last Pain:  Vitals:   08/29/20 0908  TempSrc: Oral  PainSc: 8       Patients Stated Pain Goal: 2 (08/29/20 0908)  Complications: No notable events documented.

## 2020-08-29 NOTE — Anesthesia Procedure Notes (Signed)
Procedure Name: Intubation Date/Time: 08/29/2020 11:23 AM Performed by: Rica Records, CRNA Pre-anesthesia Checklist: Patient identified, Emergency Drugs available, Suction available and Patient being monitored Patient Re-evaluated:Patient Re-evaluated prior to induction Oxygen Delivery Method: Circle system utilized Induction Type: IV induction Ventilation: Mask ventilation without difficulty Laryngoscope Size: Miller and 2 Grade View: Grade I Tube type: Oral Tube size: 7.0 mm Number of attempts: 1 Airway Equipment and Method: Stylet Placement Confirmation: ETT inserted through vocal cords under direct vision, positive ETCO2 and breath sounds checked- equal and bilateral Secured at: 21 cm Tube secured with: Tape Dental Injury: Teeth and Oropharynx as per pre-operative assessment
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
## 2020-08-30 LAB — COMPREHENSIVE METABOLIC PANEL
ALT: 16 U/L (ref 0–44)
AST: 50 U/L — ABNORMAL HIGH (ref 15–41)
Albumin: 2.9 g/dL — ABNORMAL LOW (ref 3.5–5.0)
Alkaline Phosphatase: 77 U/L (ref 38–126)
Anion gap: 8 (ref 5–15)
BUN: <5 mg/dL — ABNORMAL LOW (ref 6–20)
CO2: 23 mmol/L (ref 22–32)
Calcium: 8.8 mg/dL — ABNORMAL LOW (ref 8.9–10.3)
Chloride: 102 mmol/L (ref 98–111)
Creatinine, Ser: 0.66 mg/dL (ref 0.44–1.00)
GFR, Estimated: >60 mL/min (ref >60)
Glucose, Bld: 119 mg/dL — ABNORMAL HIGH (ref 70–99)
Potassium: 4.5 mmol/L (ref 3.5–5.1)
Sodium: 133 mmol/L — ABNORMAL LOW (ref 135–145)
Total Bilirubin: 0.5 mg/dL (ref 0.3–1.2)
Total Protein: 5.9 g/dL — ABNORMAL LOW (ref 6.5–8.1)

## 2020-08-30 LAB — CBC
HCT: 35.4 % — ABNORMAL LOW (ref 36.0–46.0)
Hemoglobin: 11.8 g/dL — ABNORMAL LOW (ref 12.0–15.0)
MCH: 28.4 pg (ref 26.0–34.0)
MCHC: 33.3 g/dL (ref 30.0–36.0)
MCV: 85.3 fL (ref 80.0–100.0)
Platelets: 250 K/uL (ref 150–400)
RBC: 4.15 MIL/uL (ref 3.87–5.11)
RDW: 14.8 % (ref 11.5–15.5)
WBC: 13.4 K/uL — ABNORMAL HIGH (ref 4.0–10.5)
nRBC: 0 % (ref 0.0–0.2)

## 2020-08-30 LAB — VITAMIN D 25 HYDROXY (VIT D DEFICIENCY, FRACTURES): Vit D, 25-Hydroxy: 19.44 ng/mL — ABNORMAL LOW (ref 30–100)

## 2020-08-30 MED ORDER — HYDROMORPHONE HCL 1 MG/ML IJ SOLN: 0.5000 mg | Freq: Three times a day (TID) | INTRAMUSCULAR | Status: DC | PRN
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
## 2020-08-30 NOTE — Op Note (Signed)
02/23/2019  9:51 PM  PATIENT:  Jennifer Schroeder  25 y.o. female  PRE-OPERATIVE DIAGNOSIS:   1. Right surgical neck proximal humerus fracture 2. Right comminuted humeral shaft fracture  POST-OPERATIVE DIAGNOSIS:   1. Right surgical neck proximal humerus fracture 2. Right comminuted humeral shaft fracture  PROCEDURE:  Procedure(s): 1. OPEN REDUCTION INTERNAL FIXATION (ORIF) PROXIMAL HUMERUS FRACTURE RIGHT 2. OPEN REDUCTION INTERNAL FIXATION (ORIF) RIGHT HUMERAL SHAFT FRACTURE  SURGEON:  Surgeon(s) and Role:    Myrene Galas, MD - Primary  PHYSICIAN ASSISTANT: Montez Morita, PA-C   ANESTHESIA:   general  I/O:  No intake/output data recorded.  SPECIMEN:  No Specimen  TOURNIQUET:  * No tourniquets in log *  DICTATION: .Note written in EPIC  BRIEF SUMMARY OF INDICATION FOR PROCEDURE:  Jennifer Schroeder is a very pleasant 25 y.o. right-hand dominant femal who sustained a proximal humerus fracture through the surgical neck as well as a separate comminuted fracture in distal the shaft. In order to most reliably restore function and reduce pain I recommended surgical repair.  I discussed risks of heart attack, stroke, infection, malunion, nonunion, loss of motion, DVT/ PE, loss of reduction, avascular necrosis, screw penetration, and need for further surgery, among others. Consent was provided to proceed.  BRIEF SUMMARY OF PROCEDURE:  After preoperative antibiotics, the patient was taken to the operating room where general anesthesia was induced. The standard deltopectoral approach was made after time-out.  Dissection was carried down to the interval where the medial anterior edge of the deltoid was mobilized.  I was able to identify the surgical neck fracture line extending obliquely. Hematoma was evacuated with curettes and lavage. Provisional fixation of this segment was then secured with a tenaculum.  C-arm confirmed appropriate alignment. Next attention was turned to the shaft fracture which had  significant comminution and required considerable distal extension of the skin incision. The biceps muscle was retracted medially and the brachialis split midline to access the shaft. Hematoma was evacuated with curettage and lavage. Provisional reduction was obtained with clamps and minifrag fixation. Instead of using two plates I selected a long synthes small frag LCDCP bent in slight valgus which could be used to span both fracture areas which had now been reduced and provisionally fixed. C arm confirmed plate placement after three screws. We proceeded with additional fixation, including standard fixation followed by locked fixation. Final images showed appropriate reduction, hardware trajectory, and length. Outstanding clinical motion was feasible on the table, including abduction, and internal and external rotation. My assistant, Montez Morita, was present and assisting throughout.  An assistant was absolutely necessary for safe and effective completion as the reduction had to be held during provisional and definitive fixation. The wound was thoroughly irrigated and then closed in a standard layered fashion using #1 Vicryl to reapproximate the superior edge of the pec and anterior edge of the delt and then 0 for reapproximation of the muscular interval, 2-0 Vicryl and nylon for the skin.  Sterile gently compressive dressing was applied and a sling with an Ace from hand to upper arm.  The patient was taken to PACU in stable condition.  PROGNOSIS:  Jennifer Schroeder will have unrestricted gentle passive range of motion of the operative shoulder at this time with active elbow, wrist, and digital motion.  Return to office in 10-14 days for removal of sutures.   Jennifer Schroeder. Jennifer Schroeder, M.D.

## 2020-08-31 ENCOUNTER — Encounter (HOSPITAL_COMMUNITY): Payer: Self-pay | Admitting: Orthopedic Surgery

## 2020-08-31 DIAGNOSIS — E559 Vitamin D deficiency, unspecified: Secondary | ICD-10-CM

## 2020-08-31 HISTORY — DX: Vitamin D deficiency, unspecified: E55.9

## 2020-08-31 LAB — CBC
HCT: 32 % — ABNORMAL LOW (ref 36.0–46.0)
Hemoglobin: 10.4 g/dL — ABNORMAL LOW (ref 12.0–15.0)
MCH: 28.5 pg (ref 26.0–34.0)
MCHC: 32.5 g/dL (ref 30.0–36.0)
MCV: 87.7 fL (ref 80.0–100.0)
Platelets: 221 10*3/uL (ref 150–400)
RBC: 3.65 MIL/uL — ABNORMAL LOW (ref 3.87–5.11)
RDW: 15 % (ref 11.5–15.5)
WBC: 10.5 10*3/uL (ref 4.0–10.5)
nRBC: 0 % (ref 0.0–0.2)

## 2020-08-31 MED ORDER — ONDANSETRON 4 MG PO TBDP: 4.0000 mg | ORAL_TABLET | Freq: Three times a day (TID) | ORAL | 0 refills | Status: AC | PRN

## 2020-08-31 MED ORDER — ACETAMINOPHEN 500 MG PO TABS: 500.0000 mg | ORAL_TABLET | Freq: Two times a day (BID) | ORAL | 0 refills | Status: DC

## 2020-08-31 MED ORDER — METHOCARBAMOL 500 MG PO TABS: 500.0000 mg | ORAL_TABLET | Freq: Three times a day (TID) | ORAL | 0 refills | Status: AC | PRN

## 2020-08-31 MED ORDER — VITAMIN D 125 MCG (5000 UT) PO CAPS: 1.0000 | ORAL_CAPSULE | Freq: Every day | ORAL | 6 refills | Status: AC

## 2020-08-31 MED ORDER — ONDANSETRON 4 MG PO TBDP: 4.0000 mg | ORAL_TABLET | Freq: Three times a day (TID) | ORAL | 0 refills | Status: DC | PRN

## 2020-08-31 MED ORDER — KETOROLAC TROMETHAMINE 10 MG PO TABS: 10.0000 mg | ORAL_TABLET | Freq: Four times a day (QID) | ORAL | 0 refills | Status: AC | PRN

## 2020-08-31 MED ORDER — OXYCODONE-ACETAMINOPHEN 7.5-325 MG PO TABS: 1.0000 | ORAL_TABLET | Freq: Three times a day (TID) | ORAL | 0 refills | Status: AC | PRN

## 2020-08-31 MED ORDER — DOCUSATE SODIUM 100 MG PO CAPS: 100.0000 mg | ORAL_CAPSULE | Freq: Two times a day (BID) | ORAL | 0 refills | Status: AC

## 2020-08-31 MED ORDER — OXYCODONE-ACETAMINOPHEN 7.5-325 MG PO TABS: 1.0000 | ORAL_TABLET | Freq: Three times a day (TID) | ORAL | 0 refills | Status: DC | PRN

## 2020-08-31 MED ORDER — KETOROLAC TROMETHAMINE 10 MG PO TABS: 10.0000 mg | ORAL_TABLET | Freq: Four times a day (QID) | ORAL | 0 refills | Status: DC | PRN

## 2020-08-31 MED ORDER — ACETAMINOPHEN 500 MG PO TABS: 500.0000 mg | ORAL_TABLET | Freq: Two times a day (BID) | ORAL | 0 refills | Status: AC

## 2020-08-31 NOTE — Discharge Instructions (Addendum)
[facility] Discharge Instructions   General Discharge Instructions  Orthopaedic Injuries:  Right humerus fracture treated with open reduction internal fixation using plate and screws  WEIGHT BEARING STATUS: Nonweightbearing right upper extremity.  No lifting anything heavier than 5 pounds with right arm  RANGE OF MOTION/ACTIVITY: No active shoulder abduction.  Passive shoulder abduction okay (chicken wing motion) active and passive shoulder flexion and extension okay.  Unrestricted range of motion of elbow, forearm wrist and hand  Sling only needs to be on when moving around.  Sling can be off when seated or in bed  Bone health: Labs show vitamin D deficiency.  Vitamin D is important for bone health.  Recommend 5000 IUs of vitamin D3 daily.  Wound Care: Daily wound care starting on 09/03/2020.  Leave open to air once wound is dry.  Clean with soap and water only  Discharge Wound Care Instructions  Do NOT apply any ointments, solutions or lotions to pin sites or surgical wounds.  These prevent needed drainage and even though solutions like hydrogen peroxide kill bacteria, they also damage cells lining the pin sites that help fight infection.  Applying lotions or ointments can keep the wounds moist and can cause them to breakdown and open up as well. This can increase the risk for infection. When in doubt call the office.  Surgical incisions should be dressed daily.  If any drainage is noted, use one layer of 4 x 4 gauze and tape.  Alternatively you can use a Mepilex type dressing which is the base dressing you have on from the hospital  Once the incision is completely dry and without drainage, it may be left open to air out.  Showering may begin 36-48 hours later.  Cleaning gently with soap and water.   Diet: as you were eating previously.  Can use over the counter stool softeners and bowel preparations, such as Miralax, to help with bowel movements.  Narcotics can be  constipating.  Be sure to drink plenty of fluids  PAIN MEDICATION USE AND EXPECTATIONS  You have likely been given narcotic medications to help control your pain.  After a traumatic event that results in an fracture (broken bone) with or without surgery, it is ok to use narcotic pain medications to help control one's pain.  We understand that everyone responds to pain differently and each individual patient will be evaluated on a regular basis for the continued need for narcotic medications. Ideally, narcotic medication use should last no more than 6-8 weeks (coinciding with fracture healing).   As a patient it is your responsibility as well to monitor narcotic medication use and report the amount and frequency you use these medications when you come to your office visit.   We would also advise that if you are using narcotic medications, you should take a dose prior to therapy to maximize you participation.  IF YOU ARE ON NARCOTIC MEDICATIONS IT IS NOT PERMISSIBLE TO OPERATE A MOTOR VEHICLE (MOTORCYCLE/CAR/TRUCK/MOPED) OR HEAVY MACHINERY DO NOT MIX NARCOTICS WITH OTHER CNS (CENTRAL NERVOUS SYSTEM) DEPRESSANTS SUCH AS ALCOHOL   POST-OPERATIVE OPIOID TAPER INSTRUCTIONS: It is important to wean off of your opioid medication as soon as possible. If you do not need pain medication after your surgery it is ok to stop day one. Opioids include: Codeine, Hydrocodone(Norco, Vicodin), Oxycodone(Percocet, oxycontin) and hydromorphone amongst others.  Long term and even short term use of opiods can cause: Increased pain response Dependence Constipation Depression Respiratory depression And more.  Withdrawal symptoms can include Flu like symptoms Nausea, vomiting And more Techniques to manage these symptoms Hydrate well Eat regular healthy meals Stay active Use relaxation techniques(deep breathing, meditating, yoga) Do Not substitute Alcohol to help with tapering If you have been on opioids for  less than two weeks and do not have pain than it is ok to stop all together.  Plan to wean off of opioids This plan should start within one week post op of your fracture surgery  Maintain the same interval or time between taking each dose and first decrease the dose.  Cut the total daily intake of opioids by one tablet each day Next start to increase the time between doses. The last dose that should be eliminated is the evening dose.    STOP SMOKING OR USING NICOTINE PRODUCTS!!!!  As discussed nicotine severely impairs your body's ability to heal surgical and traumatic wounds but also impairs bone healing.  Wounds and bone heal by forming microscopic blood vessels (angiogenesis) and nicotine is a vasoconstrictor (essentially, shrinks blood vessels).  Therefore, if vasoconstriction occurs to these microscopic blood vessels they essentially disappear and are unable to deliver necessary nutrients to the healing tissue.  This is one modifiable factor that you can do to dramatically increase your chances of healing your injury.    (This means no smoking, no nicotine gum, patches, etc)  DO NOT USE NONSTEROIDAL ANTI-INFLAMMATORY DRUGS (NSAID'S)  Using products such as Advil (ibuprofen), Aleve (naproxen), Motrin (ibuprofen) for additional pain control during fracture healing can delay and/or prevent the healing response.  If you would like to take over the counter (OTC) medication, Tylenol (acetaminophen) is ok.  However, some narcotic medications that are given for pain control contain acetaminophen as well. Therefore, you should not exceed more than 4000 mg of tylenol in a day if you do not have liver disease.  Also note that there are may OTC medicines, such as cold medicines and allergy medicines that my contain tylenol as well.  If you have any questions about medications and/or interactions please ask your doctor/PA or your pharmacist.      ICE AND ELEVATE INJURED/OPERATIVE EXTREMITY  Using ice and  elevating the injured extremity above your heart can help with swelling and pain control.  Icing in a pulsatile fashion, such as 20 minutes on and 20 minutes off, can be followed.    Do not place ice directly on skin. Make sure there is a barrier between to skin and the ice pack.    Using frozen items such as frozen peas works well as the conform nicely to the are that needs to be iced.  USE AN ACE WRAP OR TED HOSE FOR SWELLING CONTROL  In addition to icing and elevation, Ace wraps or TED hose are used to help limit and resolve swelling.  It is recommended to use Ace wraps or TED hose until you are informed to stop.    When using Ace Wraps start the wrapping distally (farthest away from the body) and wrap proximally (closer to the body)   Example: If you had surgery on your leg or thing and you do not have a splint on, start the ace wrap at the toes and work your way up to the thigh        If you had surgery on your upper extremity and do not have a splint on, start the ace wrap at your fingers and work your way up to the upper arm  IF YOU  ARE IN A SPLINT OR CAST DO NOT REMOVE IT FOR ANY REASON   If your splint gets wet for any reason please contact the office immediately. You may shower in your splint or cast as long as you keep it dry.  This can be done by wrapping in a cast cover or garbage back (or similar)  Do Not stick any thing down your splint or cast such as pencils, money, or hangers to try and scratch yourself with.  If you feel itchy take benadryl as prescribed on the bottle for itching  IF YOU ARE IN A CAM BOOT (BLACK BOOT)  You may remove boot periodically. Perform daily dressing changes as noted below.  Wash the liner of the boot regularly and wear a sock when wearing the boot. It is recommended that you sleep in the boot until told otherwise    Call office for the following: Temperature greater than 101F Persistent nausea and vomiting Severe uncontrolled pain Redness,  tenderness, or signs of infection (pain, swelling, redness, odor or green/yellow discharge around the site) Difficulty breathing, headache or visual disturbances Hives Persistent dizziness or light-headedness Extreme fatigue Any other questions or concerns you may have after discharge  In an emergency, call 911 or go to an Emergency Department at a nearby hospital  HELPFUL INFORMATION  If you had a block, it will wear off between 8-24 hrs postop typically.  This is period when your pain may go from nearly zero to the pain you would have had postop without the block.  This is an abrupt transition but nothing dangerous is happening.  You may take an extra dose of narcotic when this happens.  You should wean off your narcotic medicines as soon as you are able.  Most patients will be off or using minimal narcotics before their first postop appointment.   We suggest you use the pain medication the first night prior to going to bed, in order to ease any pain when the anesthesia wears off. You should avoid taking pain medications on an empty stomach as it will make you nauseous.  Do not drink alcoholic beverages or take illicit drugs when taking pain medications.  In most states it is against the law to drive while you are in a splint or sling.  And certainly against the law to drive while taking narcotics.  You may return to work/school in the next couple of days when you feel up to it.   Pain medication may make you constipated.  Below are a few solutions to try in this order: Decrease the amount of pain medication if you aren't having pain. Drink lots of decaffeinated fluids. Drink prune juice and/or each dried prunes  If the first 3 don't work start with additional solutions Take Colace - an over-the-counter stool softener Take Senokot - an over-the-counter laxative Take Miralax - a stronger over-the-counter laxative     CALL THE OFFICE WITH ANY QUESTIONS OR CONCERNS: [phone]    VISIT OUR WEBSITE FOR ADDITIONAL INFORMATION: [email or web address]
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
## 2020-08-31 NOTE — Progress Notes (Signed)
Discharge instructions covered with pt and mother, all questions answered and pt verbalized understanding. No further needs at this time. IV removed, intact. Pt taken out by [facility] with RN and mother to family car.

## 2020-08-31 NOTE — Plan of Care (Signed)
  Problem: Education: Goal: Knowledge of General Education information will improve Description: Including pain rating scale, medication(s)/side effects and non-pharmacologic comfort measures 08/31/2020 1344 by Thayer Headings, RN Outcome: Adequate for Discharge 08/31/2020 1344 by Thayer Headings, RN Outcome: Progressing   Problem: Clinical Measurements: Goal: Ability to maintain clinical measurements within normal limits will improve 08/31/2020 1344 by Thayer Headings, RN Outcome: Adequate for Discharge 08/31/2020 1344 by Thayer Headings, RN Outcome: Progressing Goal: Will remain free from infection 08/31/2020 1344 by Thayer Headings, RN Outcome: Adequate for Discharge 08/31/2020 1344 by Thayer Headings, RN Outcome: Progressing Goal: Diagnostic test results will improve 08/31/2020 1344 by Thayer Headings, RN Outcome: Adequate for Discharge 08/31/2020 1344 by Thayer Headings, RN Outcome: Progressing Goal: Respiratory complications will improve 08/31/2020 1344 by Thayer Headings, RN Outcome: Adequate for Discharge 08/31/2020 1344 by Thayer Headings, RN Outcome: Progressing Goal: Cardiovascular complication will be avoided 08/31/2020 1344 by Thayer Headings, RN Outcome: Adequate for Discharge 08/31/2020 1344 by Thayer Headings, RN Outcome: Progressing   Problem: Clinical Measurements: Goal: Will remain free from infection 08/31/2020 1344 by Thayer Headings, RN Outcome: Adequate for Discharge 08/31/2020 1344 by Thayer Headings, RN Outcome: Progressing   Problem: Clinical Measurements: Goal: Diagnostic test results will improve 08/31/2020 1344 by Thayer Headings, RN Outcome: Adequate for Discharge 08/31/2020 1344 by Thayer Headings, RN Outcome: Progressing   Problem: Clinical Measurements: Goal: Respiratory complications will improve 08/31/2020 1344 by Thayer Headings, RN Outcome: Adequate for Discharge 08/31/2020 1344 by Thayer Headings, RN Outcome: Progressing    Problem: Elimination: Goal: Will not experience complications related to bowel motility 08/31/2020 1344 by Thayer Headings, RN Outcome: Adequate for Discharge 08/31/2020 1344 by Thayer Headings, RN Outcome: Progressing Goal: Will not experience complications related to urinary retention 08/31/2020 1344 by Thayer Headings, RN Outcome: Adequate for Discharge 08/31/2020 1344 by Thayer Headings, RN Outcome: Progressing   Problem: Coping: Goal: Level of anxiety will decrease 08/31/2020 1344 by Thayer Headings, RN Outcome: Adequate for Discharge 08/31/2020 1344 by Thayer Headings, RN Outcome: Progressing   Problem: Nutrition: Goal: Adequate nutrition will be maintained 08/31/2020 1344 by Thayer Headings, RN Outcome: Adequate for Discharge 08/31/2020 1344 by Thayer Headings, RN Outcome: Progressing

## 2020-09-01 ENCOUNTER — Encounter (HOSPITAL_COMMUNITY): Payer: Self-pay | Admitting: Orthopedic Surgery

## 2020-09-01 NOTE — Addendum Note (Signed)
Addendum  created 09/01/20 1517 by Adair Laundry, CRNA   Intraprocedure Meds edited

## 2020-09-10 ENCOUNTER — Encounter (HOSPITAL_COMMUNITY): Payer: Self-pay | Admitting: Orthopedic Surgery

## 2020-09-10 DIAGNOSIS — S42201A Unspecified fracture of upper end of right humerus, initial encounter for closed fracture: Secondary | ICD-10-CM | POA: Diagnosis present

## 2020-09-10 HISTORY — DX: Unspecified fracture of upper end of right humerus, initial encounter for closed fracture: S42.201A

## 2021-07-05 ENCOUNTER — Emergency Department (HOSPITAL_BASED_OUTPATIENT_CLINIC_OR_DEPARTMENT_OTHER)
Admission: EM | Admit: 2021-07-05 | Discharge: 2021-07-05 | Disposition: A | Discharge status: Home / Self Care | Payer: Self-pay | Attending: Emergency Medicine | Admitting: Emergency Medicine

## 2021-07-05 ENCOUNTER — Encounter (HOSPITAL_BASED_OUTPATIENT_CLINIC_OR_DEPARTMENT_OTHER): Payer: Self-pay | Admitting: Emergency Medicine

## 2021-07-05 ENCOUNTER — Other Ambulatory Visit: Payer: Self-pay

## 2021-07-05 DIAGNOSIS — O2341 Unspecified infection of urinary tract in pregnancy, first trimester: Secondary | ICD-10-CM | POA: Insufficient documentation

## 2021-07-05 DIAGNOSIS — B9689 Other specified bacterial agents as the cause of diseases classified elsewhere: Secondary | ICD-10-CM | POA: Insufficient documentation

## 2021-07-05 DIAGNOSIS — A599 Trichomoniasis, unspecified: Secondary | ICD-10-CM | POA: Insufficient documentation

## 2021-07-05 DIAGNOSIS — Z3A01 Less than 8 weeks gestation of pregnancy: Secondary | ICD-10-CM | POA: Insufficient documentation

## 2021-07-05 DIAGNOSIS — N390 Urinary tract infection, site not specified: Secondary | ICD-10-CM

## 2021-07-05 DIAGNOSIS — R102 Pelvic and perineal pain: Secondary | ICD-10-CM | POA: Insufficient documentation

## 2021-07-05 LAB — URINALYSIS, MICROSCOPIC (REFLEX)

## 2021-07-05 LAB — CBC WITH DIFFERENTIAL/PLATELET
Abs Immature Granulocytes: 0.06 10*3/uL (ref 0.00–0.07)
Basophils Absolute: 0 10*3/uL (ref 0.0–0.1)
Basophils Relative: 0 %
Eosinophils Absolute: 0.1 10*3/uL (ref 0.0–0.5)
Eosinophils Relative: 1 %
HCT: 37.6 % (ref 36.0–46.0)
Hemoglobin: 12.6 g/dL (ref 12.0–15.0)
Immature Granulocytes: 1 %
Lymphocytes Relative: 36 %
Lymphs Abs: 3.2 10*3/uL (ref 0.7–4.0)
MCH: 28.1 pg (ref 26.0–34.0)
MCHC: 33.5 g/dL (ref 30.0–36.0)
MCV: 83.9 fL (ref 80.0–100.0)
Monocytes Absolute: 0.8 10*3/uL (ref 0.1–1.0)
Monocytes Relative: 9 %
Neutro Abs: 4.7 10*3/uL (ref 1.7–7.7)
Neutrophils Relative %: 53 %
Platelets: 193 10*3/uL (ref 150–400)
RBC: 4.48 MIL/uL (ref 3.87–5.11)
RDW: 14.6 % (ref 11.5–15.5)
WBC: 8.9 10*3/uL (ref 4.0–10.5)
nRBC: 0 % (ref 0.0–0.2)

## 2021-07-05 LAB — URINALYSIS, ROUTINE W REFLEX MICROSCOPIC
Bilirubin Urine: NEGATIVE
Glucose, UA: NEGATIVE mg/dL
Hgb urine dipstick: NEGATIVE
Ketones, ur: NEGATIVE mg/dL
Nitrite: NEGATIVE
Protein, ur: NEGATIVE mg/dL
Specific Gravity, Urine: 1.025 (ref 1.005–1.030)
pH: 6 (ref 5.0–8.0)

## 2021-07-05 LAB — WET PREP, GENITAL
Clue Cells Wet Prep HPF POC: NONE SEEN
Sperm: NONE SEEN
WBC, Wet Prep HPF POC: >=10 — AB (ref <10)
Yeast Wet Prep HPF POC: NONE SEEN

## 2021-07-05 LAB — PREGNANCY, URINE: Preg Test, Ur: POSITIVE — AB

## 2021-07-05 MED ORDER — METRONIDAZOLE 500 MG PO TABS: 500.0000 mg | ORAL_TABLET | Freq: Two times a day (BID) | ORAL | 0 refills | Status: AC

## 2021-07-05 MED ORDER — CEPHALEXIN 250 MG PO CAPS
500.0000 mg | ORAL_CAPSULE | Freq: Once | ORAL | Status: AC
  Administered 2021-07-05: 500 mg via ORAL
  Filled 2021-07-05: qty 2

## 2021-07-05 MED ORDER — METRONIDAZOLE 500 MG PO TABS
500.0000 mg | ORAL_TABLET | Freq: Once | ORAL | Status: AC
  Administered 2021-07-05: 500 mg via ORAL
  Filled 2021-07-05: qty 1

## 2021-07-05 MED ORDER — CEPHALEXIN 500 MG PO CAPS: 500.0000 mg | ORAL_CAPSULE | Freq: Two times a day (BID) | ORAL | 0 refills | Status: AC

## 2021-07-05 NOTE — ED Triage Notes (Signed)
Pt reports intermittent generalized lower abd pain x 1 wk; also wants a pregnancy test
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
## 2021-07-06 ENCOUNTER — Ambulatory Visit (HOSPITAL_BASED_OUTPATIENT_CLINIC_OR_DEPARTMENT_OTHER): Admit: 2021-07-06 | Payer: Self-pay

## 2021-07-06 LAB — HCG, QUANTITATIVE, PREGNANCY: hCG, Beta Chain, Quant, S: 6961 m[IU]/mL — ABNORMAL HIGH (ref <5)

## 2021-07-07 LAB — GC/CHLAMYDIA PROBE AMP (~~LOC~~) NOT AT ARMC
Chlamydia: NEGATIVE
Comment: NEGATIVE
Comment: NORMAL
Neisseria Gonorrhea: NEGATIVE

## 2021-07-14 ENCOUNTER — Other Ambulatory Visit (HOSPITAL_BASED_OUTPATIENT_CLINIC_OR_DEPARTMENT_OTHER): Payer: Self-pay | Admitting: Emergency Medicine

## 2021-07-14 ENCOUNTER — Ambulatory Visit (HOSPITAL_BASED_OUTPATIENT_CLINIC_OR_DEPARTMENT_OTHER)
Admission: RE | Admit: 2021-07-14 | Discharge: 2021-07-14 | Disposition: A | Discharge status: Home / Self Care | Payer: Self-pay | Source: Ambulatory Visit | Attending: Emergency Medicine | Admitting: Emergency Medicine

## 2021-07-14 DIAGNOSIS — R1030 Lower abdominal pain, unspecified: Secondary | ICD-10-CM
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
## 2023-11-24 IMAGING — US US OB < 14 WEEKS - US OB TV
1 series · 14 of 28 positions shown · non-contrast
Comparison: None Available.

CLINICAL DATA: Abdominal pain. Estimated gestational age of 6
weeks, 4 days by LMP.

EXAM:
OBSTETRIC <14 WK US AND TRANSVAGINAL OB US
TECHNIQUE: Both transabdominal and transvaginal ultrasound examinations were
performed for complete evaluation of the gestation as well as the
maternal uterus, adnexal regions, and pelvic cul-de-sac.
Transvaginal technique was performed to assess early pregnancy.

[Series 1: us ob < 14 weeks - us ob tv · 54 acquisitions, 14 frames shown]
[im 2/54]
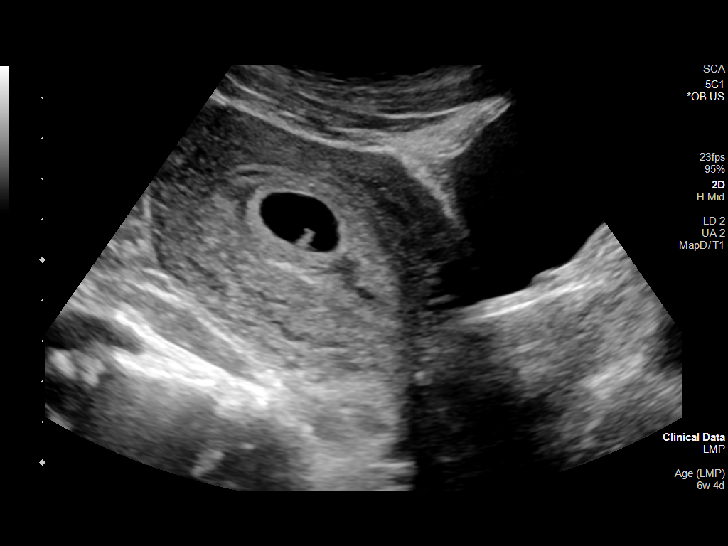
[im 6/54]
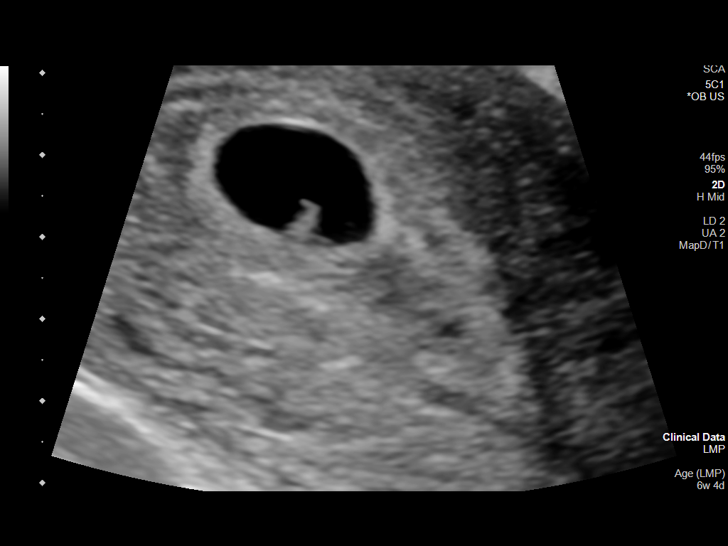
[im 10/54]
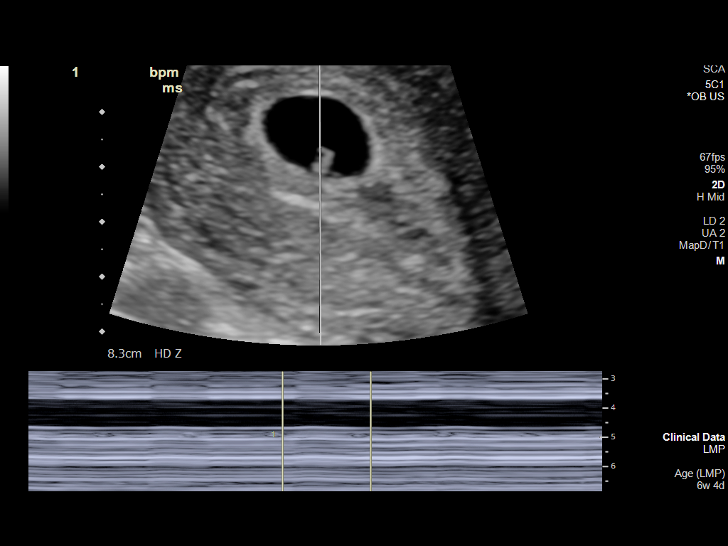
[im 14/54]
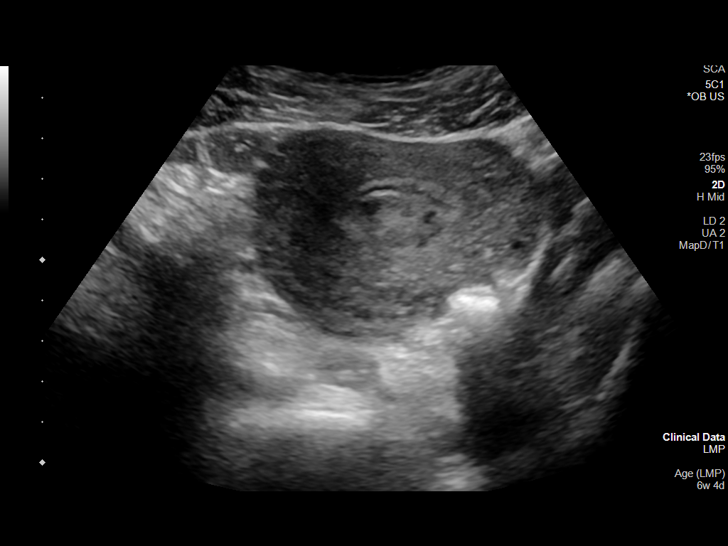
[im 18/54]
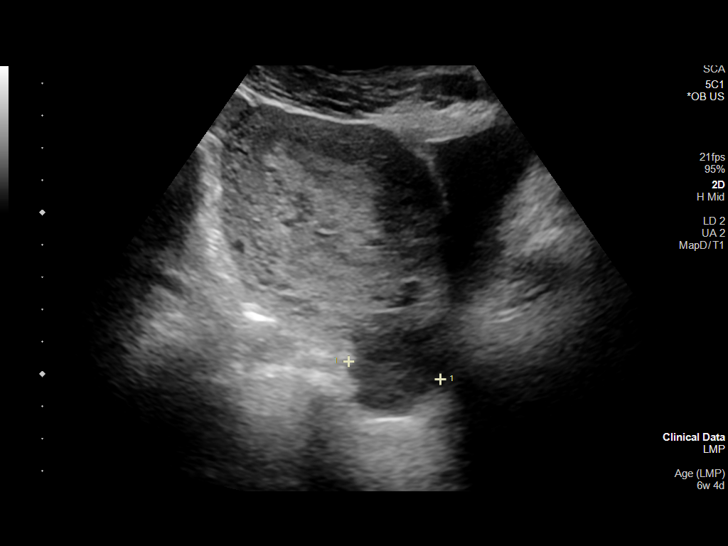
[im 22/54]
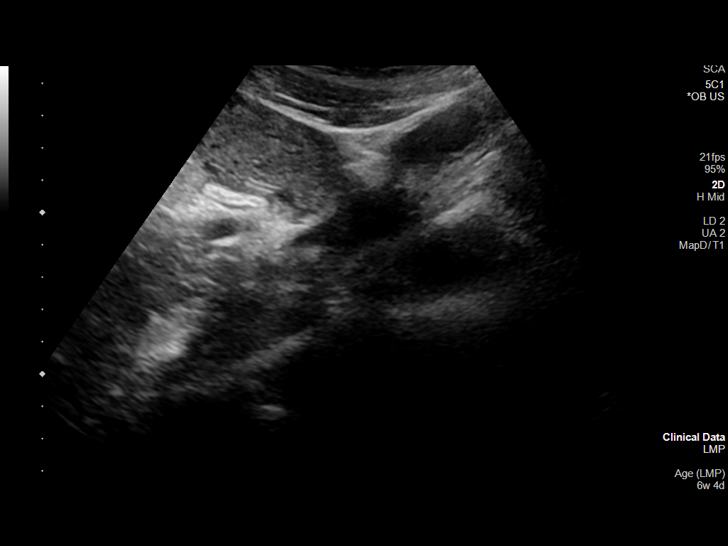
[im 26/54]
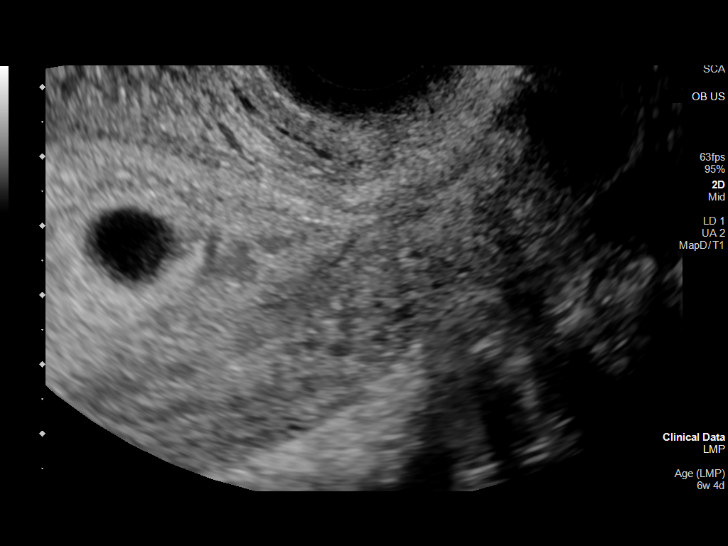
[im 30/54]
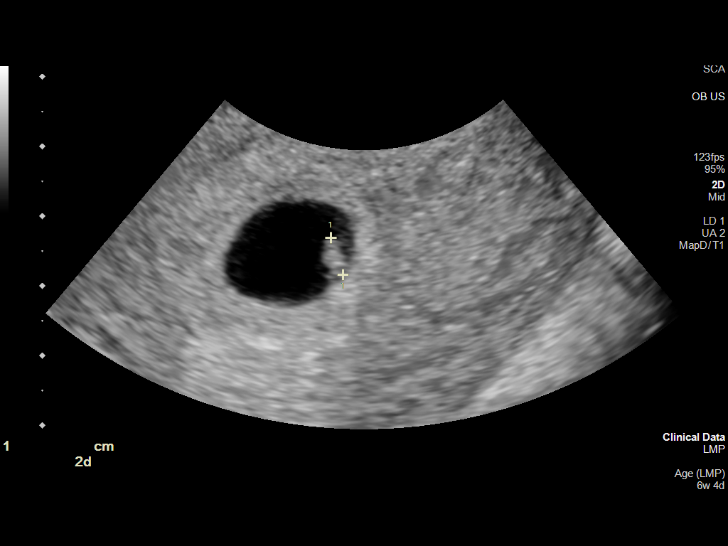
[im 34/54]
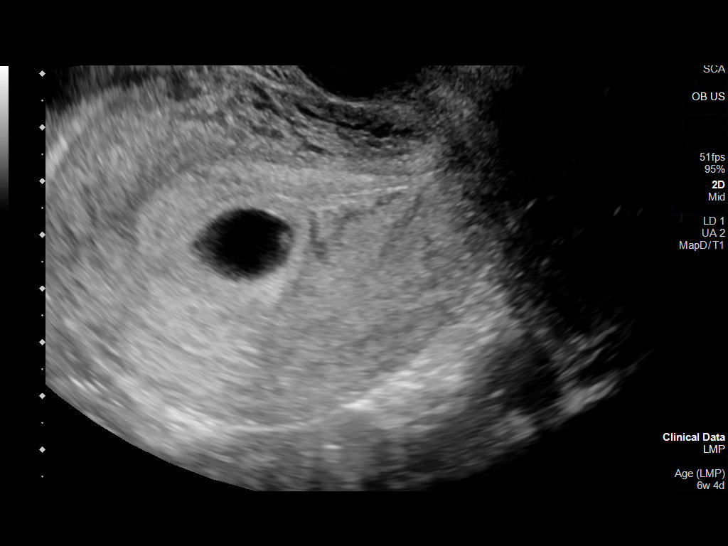
[im 38/54]
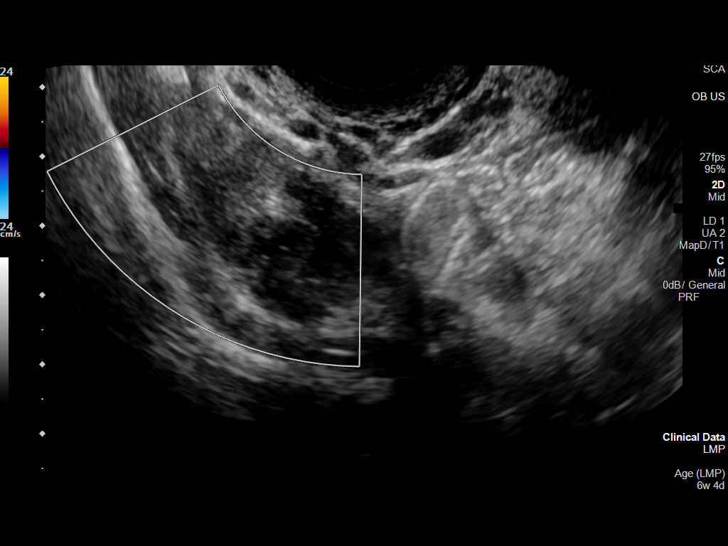
[im 42/54]
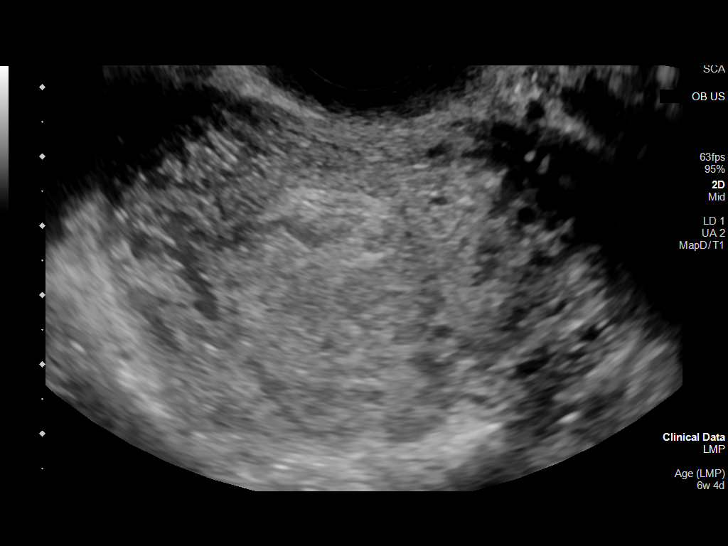
[im 46/54]
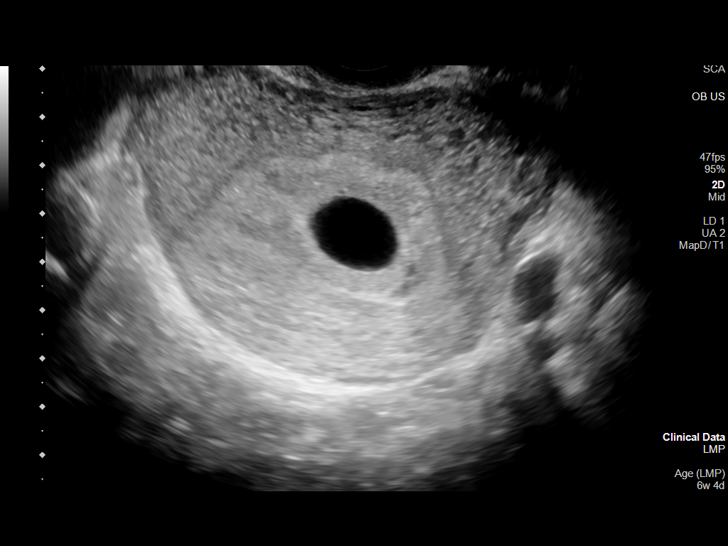
[im 50/54]
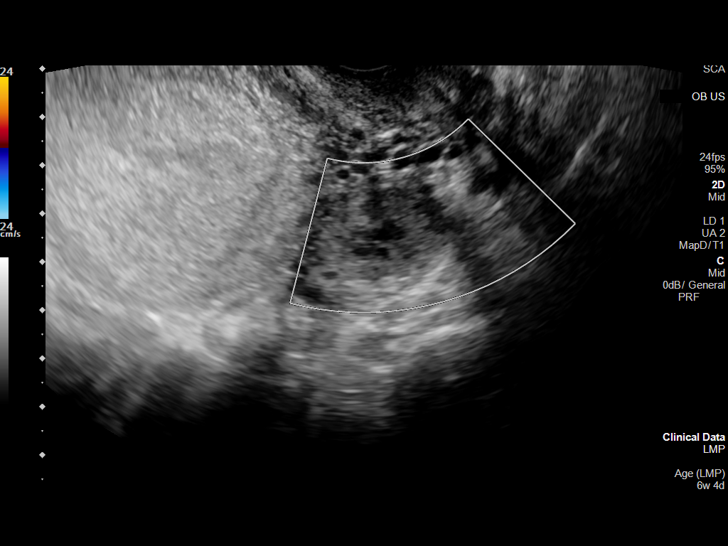
[im 54/54]
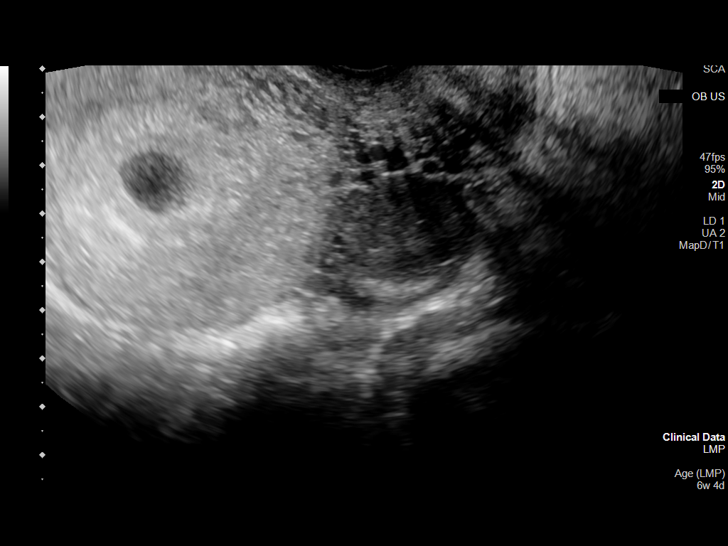

[14 of 28 positions shown; findings below may reference images not displayed]

FINDINGS: Intrauterine gestational sac: Single.

Yolk sac:  Visualized.

Embryo:  Visualized.

Cardiac Activity: Visualized.

Heart Rate: 116 bpm

CRL:  5.6 mm   6 w   2 d                  US EDC: 03/07/2022

Subchorionic hemorrhage:  None visualized.

Maternal uterus/adnexae: Unremarkable.
IMPRESSION: 1. Single live intrauterine pregnancy with estimated gestational age
of 6 weeks, 2 days. No acute abnormality.
